# Patient Record
Sex: Female | Born: 1970 | Race: Black or African American | Hispanic: No | Marital: Married | Smoking: Never smoker
Health system: Southern US, Community
[De-identification: ages and names within clinical notes are randomized; demographics above are authoritative.]

## PROBLEM LIST (undated history)

## (undated) DIAGNOSIS — R000 Tachycardia, unspecified: Secondary | ICD-10-CM

## (undated) DIAGNOSIS — R12 Heartburn: Secondary | ICD-10-CM

## (undated) DIAGNOSIS — E039 Hypothyroidism, unspecified: Secondary | ICD-10-CM

## (undated) DIAGNOSIS — D219 Benign neoplasm of connective and other soft tissue, unspecified: Secondary | ICD-10-CM

## (undated) DIAGNOSIS — J302 Other seasonal allergic rhinitis: Secondary | ICD-10-CM

## (undated) DIAGNOSIS — E785 Hyperlipidemia, unspecified: Secondary | ICD-10-CM

## (undated) DIAGNOSIS — E282 Polycystic ovarian syndrome: Secondary | ICD-10-CM

## (undated) HISTORY — DX: Tachycardia, unspecified: R00.0

## (undated) HISTORY — DX: Benign neoplasm of connective and other soft tissue, unspecified: D21.9

## (undated) HISTORY — DX: Polycystic ovarian syndrome: E28.2

## (undated) HISTORY — DX: Other seasonal allergic rhinitis: J30.2

---

## 1998-02-16 ENCOUNTER — Emergency Department (HOSPITAL_COMMUNITY): Admission: EM | Admit: 1998-02-16 | Discharge: 1998-02-16 | Payer: Self-pay | Admitting: Emergency Medicine

## 1998-03-09 ENCOUNTER — Ambulatory Visit (HOSPITAL_COMMUNITY): Admission: RE | Admit: 1998-03-09 | Discharge: 1998-03-09 | Payer: Self-pay | Admitting: Obstetrics

## 1998-03-09 ENCOUNTER — Other Ambulatory Visit: Admission: RE | Admit: 1998-03-09 | Discharge: 1998-03-09 | Payer: Self-pay | Admitting: Obstetrics

## 1998-04-12 ENCOUNTER — Ambulatory Visit (HOSPITAL_COMMUNITY): Admission: RE | Admit: 1998-04-12 | Discharge: 1998-04-12 | Payer: Self-pay | Admitting: Obstetrics

## 1998-04-22 ENCOUNTER — Encounter: Payer: Self-pay | Admitting: Endocrinology

## 1998-04-22 ENCOUNTER — Ambulatory Visit (HOSPITAL_COMMUNITY): Admission: RE | Admit: 1998-04-22 | Discharge: 1998-04-22 | Payer: Self-pay | Admitting: Endocrinology

## 1998-05-18 ENCOUNTER — Ambulatory Visit (HOSPITAL_COMMUNITY): Admission: RE | Admit: 1998-05-18 | Discharge: 1998-05-18 | Payer: Self-pay | Admitting: Endocrinology

## 1998-05-18 ENCOUNTER — Encounter: Payer: Self-pay | Admitting: Endocrinology

## 1999-02-16 ENCOUNTER — Other Ambulatory Visit: Admission: RE | Admit: 1999-02-16 | Discharge: 1999-02-16 | Payer: Self-pay | Admitting: Obstetrics

## 2000-03-07 ENCOUNTER — Other Ambulatory Visit: Admission: RE | Admit: 2000-03-07 | Discharge: 2000-03-07 | Payer: Self-pay | Admitting: *Deleted

## 2001-03-14 ENCOUNTER — Other Ambulatory Visit: Admission: RE | Admit: 2001-03-14 | Discharge: 2001-03-14 | Payer: Self-pay | Admitting: *Deleted

## 2001-07-12 ENCOUNTER — Encounter: Admission: RE | Admit: 2001-07-12 | Discharge: 2001-07-12 | Payer: Self-pay | Admitting: *Deleted

## 2001-07-24 ENCOUNTER — Encounter: Admission: RE | Admit: 2001-07-24 | Discharge: 2001-07-24 | Payer: Self-pay | Admitting: *Deleted

## 2001-08-06 ENCOUNTER — Encounter: Payer: Self-pay | Admitting: Ophthalmology

## 2001-08-06 ENCOUNTER — Ambulatory Visit (HOSPITAL_COMMUNITY): Admission: RE | Admit: 2001-08-06 | Discharge: 2001-08-06 | Payer: Self-pay | Admitting: Ophthalmology

## 2002-01-08 ENCOUNTER — Emergency Department (HOSPITAL_COMMUNITY): Admission: EM | Admit: 2002-01-08 | Discharge: 2002-01-08 | Payer: Self-pay | Admitting: *Deleted

## 2002-03-19 ENCOUNTER — Other Ambulatory Visit: Admission: RE | Admit: 2002-03-19 | Discharge: 2002-03-19 | Payer: Self-pay | Admitting: Gynecology

## 2002-06-23 ENCOUNTER — Other Ambulatory Visit: Admission: RE | Admit: 2002-06-23 | Discharge: 2002-06-23 | Payer: Self-pay | Admitting: *Deleted

## 2002-08-08 ENCOUNTER — Encounter: Admission: RE | Admit: 2002-08-08 | Discharge: 2002-08-08 | Payer: Self-pay | Admitting: *Deleted

## 2002-08-08 ENCOUNTER — Encounter: Payer: Self-pay | Admitting: *Deleted

## 2002-10-24 ENCOUNTER — Other Ambulatory Visit: Admission: RE | Admit: 2002-10-24 | Discharge: 2002-10-24 | Payer: Self-pay | Admitting: Obstetrics and Gynecology

## 2003-04-17 ENCOUNTER — Other Ambulatory Visit: Admission: RE | Admit: 2003-04-17 | Discharge: 2003-04-17 | Payer: Self-pay | Admitting: Obstetrics and Gynecology

## 2004-06-22 ENCOUNTER — Other Ambulatory Visit: Admission: RE | Admit: 2004-06-22 | Discharge: 2004-06-22 | Payer: Self-pay | Admitting: Obstetrics and Gynecology

## 2005-06-06 ENCOUNTER — Emergency Department (HOSPITAL_COMMUNITY): Admission: EM | Admit: 2005-06-06 | Discharge: 2005-06-06 | Payer: Self-pay | Admitting: Emergency Medicine

## 2005-06-23 ENCOUNTER — Other Ambulatory Visit: Admission: RE | Admit: 2005-06-23 | Discharge: 2005-06-23 | Payer: Self-pay | Admitting: Obstetrics and Gynecology

## 2006-05-05 ENCOUNTER — Emergency Department (HOSPITAL_COMMUNITY): Admission: EM | Admit: 2006-05-05 | Discharge: 2006-05-05 | Payer: Self-pay | Admitting: Family Medicine

## 2006-09-21 ENCOUNTER — Other Ambulatory Visit: Admission: RE | Admit: 2006-09-21 | Discharge: 2006-09-21 | Payer: Self-pay | Admitting: Obstetrics and Gynecology

## 2007-07-08 ENCOUNTER — Ambulatory Visit (HOSPITAL_COMMUNITY): Admission: RE | Admit: 2007-07-08 | Discharge: 2007-07-08 | Payer: Self-pay | Admitting: Otolaryngology

## 2007-09-25 ENCOUNTER — Other Ambulatory Visit: Admission: RE | Admit: 2007-09-25 | Discharge: 2007-09-25 | Payer: Self-pay | Admitting: Obstetrics and Gynecology

## 2007-10-24 ENCOUNTER — Emergency Department (HOSPITAL_COMMUNITY): Admission: EM | Admit: 2007-10-24 | Discharge: 2007-10-24 | Payer: Self-pay | Admitting: Emergency Medicine

## 2008-04-09 ENCOUNTER — Ambulatory Visit: Payer: Self-pay | Admitting: Women's Health

## 2008-04-21 ENCOUNTER — Ambulatory Visit: Payer: Self-pay | Admitting: Obstetrics and Gynecology

## 2008-04-28 ENCOUNTER — Ambulatory Visit: Payer: Self-pay | Admitting: Obstetrics and Gynecology

## 2008-04-28 ENCOUNTER — Ambulatory Visit (HOSPITAL_COMMUNITY): Admission: RE | Admit: 2008-04-28 | Discharge: 2008-04-28 | Payer: Self-pay | Admitting: Obstetrics and Gynecology

## 2008-05-05 ENCOUNTER — Encounter: Admission: RE | Admit: 2008-05-05 | Discharge: 2008-05-05 | Payer: Self-pay | Admitting: Otolaryngology

## 2008-05-11 ENCOUNTER — Ambulatory Visit: Payer: Self-pay | Admitting: Obstetrics and Gynecology

## 2008-05-15 ENCOUNTER — Ambulatory Visit: Payer: Self-pay | Admitting: Obstetrics and Gynecology

## 2008-06-15 ENCOUNTER — Ambulatory Visit: Payer: Self-pay | Admitting: Gynecology

## 2008-07-07 ENCOUNTER — Ambulatory Visit: Payer: Self-pay | Admitting: Obstetrics and Gynecology

## 2008-07-13 ENCOUNTER — Ambulatory Visit: Payer: Self-pay | Admitting: Obstetrics and Gynecology

## 2008-08-03 ENCOUNTER — Ambulatory Visit: Payer: Self-pay | Admitting: Obstetrics and Gynecology

## 2008-08-07 ENCOUNTER — Ambulatory Visit: Payer: Self-pay | Admitting: Obstetrics and Gynecology

## 2008-08-10 ENCOUNTER — Ambulatory Visit: Payer: Self-pay | Admitting: Obstetrics and Gynecology

## 2008-09-14 ENCOUNTER — Ambulatory Visit: Payer: Self-pay | Admitting: Obstetrics and Gynecology

## 2008-12-03 ENCOUNTER — Other Ambulatory Visit: Admission: RE | Admit: 2008-12-03 | Discharge: 2008-12-03 | Payer: Self-pay | Admitting: Obstetrics and Gynecology

## 2008-12-03 ENCOUNTER — Ambulatory Visit: Payer: Self-pay | Admitting: Obstetrics and Gynecology

## 2008-12-03 ENCOUNTER — Encounter: Payer: Self-pay | Admitting: Obstetrics and Gynecology

## 2009-04-12 ENCOUNTER — Ambulatory Visit: Payer: Self-pay | Admitting: Obstetrics and Gynecology

## 2009-04-13 ENCOUNTER — Ambulatory Visit: Payer: Self-pay | Admitting: Obstetrics and Gynecology

## 2009-05-11 ENCOUNTER — Ambulatory Visit: Payer: Self-pay | Admitting: Obstetrics and Gynecology

## 2009-06-05 HISTORY — PX: MYOMECTOMY: SHX85

## 2009-06-15 ENCOUNTER — Encounter: Payer: Self-pay | Admitting: Obstetrics and Gynecology

## 2009-06-15 ENCOUNTER — Ambulatory Visit: Payer: Self-pay | Admitting: Obstetrics and Gynecology

## 2009-06-15 ENCOUNTER — Inpatient Hospital Stay (HOSPITAL_COMMUNITY): Admission: RE | Admit: 2009-06-15 | Discharge: 2009-06-17 | Payer: Self-pay | Admitting: Obstetrics and Gynecology

## 2009-06-28 ENCOUNTER — Ambulatory Visit: Payer: Self-pay | Admitting: Obstetrics and Gynecology

## 2009-06-29 ENCOUNTER — Encounter: Admission: RE | Admit: 2009-06-29 | Discharge: 2009-06-29 | Payer: Self-pay | Admitting: Family Medicine

## 2009-07-06 ENCOUNTER — Ambulatory Visit: Payer: Self-pay | Admitting: Obstetrics and Gynecology

## 2009-07-15 ENCOUNTER — Ambulatory Visit: Payer: Self-pay | Admitting: Obstetrics and Gynecology

## 2009-09-02 ENCOUNTER — Ambulatory Visit: Payer: Self-pay | Admitting: Obstetrics and Gynecology

## 2010-01-07 IMAGING — RF DG HYSTEROGRAM
5 series · 5 of 5 positions shown · non-contrast
Comparison: none

CLINICAL DATA: Infertility

HYSTEROSALPINGOGRAM
TECHNIQUE: Hysterosalpingogram was performed by the ordering
physician under fluoroscopy.  Fluoroscopic images are submitted for
interpretation following the procedure.
Fluoroscopy time:  0.8 minutes

[Series 1: run · 1 of 1 slices shown (1 of 5)]
[im 1/1]
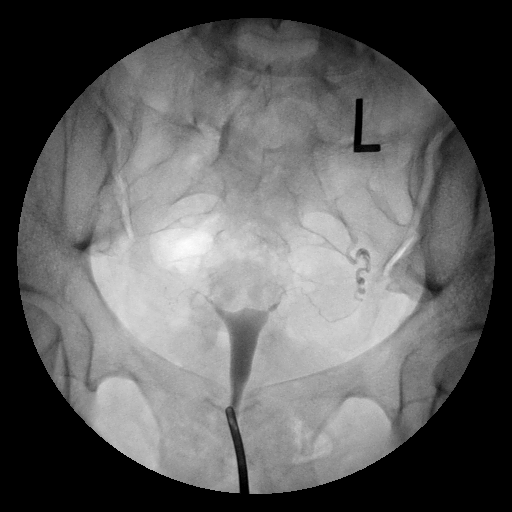

[Series 2: run · 1 of 1 slices shown (2 of 5)]
[im 1/1]
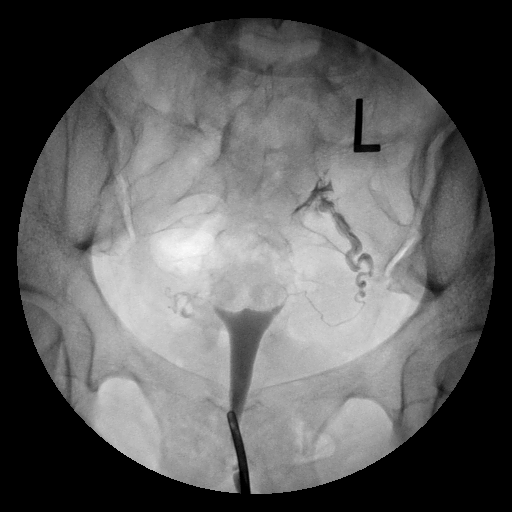

[Series 3: run · 1 of 1 slices shown (3 of 5)]
[im 1/1]
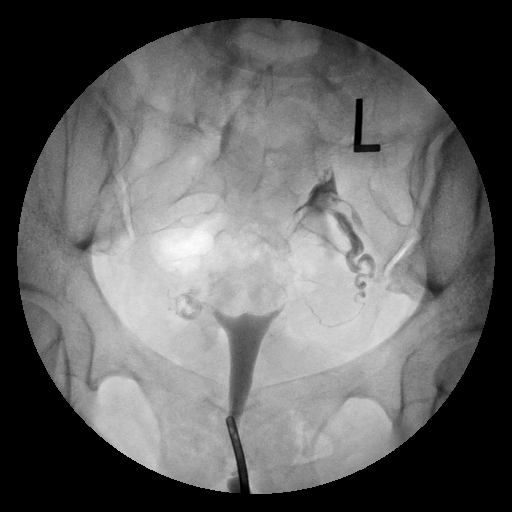

[Series 4: run · 1 of 1 slices shown (4 of 5)]
[im 1/1]
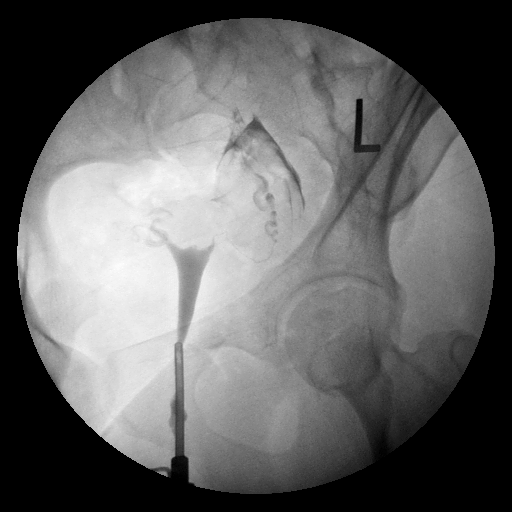

[Series 5: run · 1 of 1 slices shown (5 of 5)]
[im 1/1]
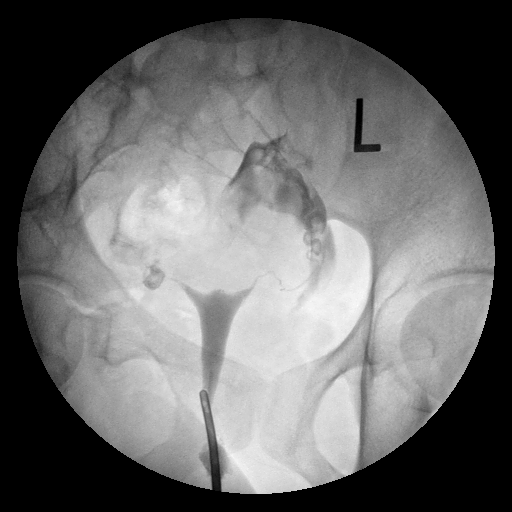

[5 of 5 positions shown; findings below may reference images not displayed]

FINDINGS: Endometrial cavity is normal in contour and
opacification.  No filling defects are identified in the
endometrial cavity.

The left fallopian tube is well opacified with contrast and there
is definite free intraperitoneal spill from the left fallopian
tube.

The right fallopian tube is normal in caliber and is opacified to
its distal aspect.  No definite free intraperitoneal spill from the
right fallopian tube is appreciated on the images provided.
IMPRESSION: 1.  The left fallopian tube is patent.
2.  The right fallopian tube is opacified to its distal aspect.  No
definite free intraperitoneal spill is identified from the right
ovary on these static images. Correlation with findings at real
time imaging is suggested.
3.  Normal appearance of the endometrial cavity.

## 2010-02-24 ENCOUNTER — Encounter: Admission: RE | Admit: 2010-02-24 | Discharge: 2010-02-24 | Payer: Self-pay | Admitting: Endocrinology

## 2010-06-15 ENCOUNTER — Other Ambulatory Visit
Admission: RE | Admit: 2010-06-15 | Discharge: 2010-06-15 | Payer: Self-pay | Source: Home / Self Care | Admitting: Obstetrics and Gynecology

## 2010-06-15 ENCOUNTER — Ambulatory Visit
Admission: RE | Admit: 2010-06-15 | Discharge: 2010-06-15 | Payer: Self-pay | Source: Home / Self Care | Attending: Obstetrics and Gynecology | Admitting: Obstetrics and Gynecology

## 2010-07-13 ENCOUNTER — Ambulatory Visit (INDEPENDENT_AMBULATORY_CARE_PROVIDER_SITE_OTHER): Payer: Self-pay | Admitting: Women's Health

## 2010-07-13 DIAGNOSIS — N898 Other specified noninflammatory disorders of vagina: Secondary | ICD-10-CM

## 2010-07-13 DIAGNOSIS — Z113 Encounter for screening for infections with a predominantly sexual mode of transmission: Secondary | ICD-10-CM

## 2010-07-13 DIAGNOSIS — B373 Candidiasis of vulva and vagina: Secondary | ICD-10-CM

## 2010-07-13 DIAGNOSIS — E039 Hypothyroidism, unspecified: Secondary | ICD-10-CM

## 2010-08-21 LAB — CBC
HCT: 40.2 % (ref 36.0–46.0)
Hemoglobin: 13.7 g/dL (ref 12.0–15.0)
MCHC: 34 g/dL (ref 30.0–36.0)
MCV: 88.9 fL (ref 78.0–100.0)
RBC: 4.52 MIL/uL (ref 3.87–5.11)
WBC: 4.3 10*3/uL (ref 4.0–10.5)

## 2010-08-21 LAB — URINALYSIS, ROUTINE W REFLEX MICROSCOPIC
Bilirubin Urine: NEGATIVE
Glucose, UA: NEGATIVE mg/dL
Hgb urine dipstick: NEGATIVE
Ketones, ur: NEGATIVE mg/dL
Nitrite: NEGATIVE
Specific Gravity, Urine: 1.01 (ref 1.005–1.030)
pH: 7.5 (ref 5.0–8.0)

## 2010-08-21 LAB — HCG, SERUM, QUALITATIVE: Preg, Serum: NEGATIVE

## 2010-10-07 ENCOUNTER — Ambulatory Visit (INDEPENDENT_AMBULATORY_CARE_PROVIDER_SITE_OTHER): Payer: BC Managed Care – PPO | Admitting: Women's Health

## 2010-10-07 DIAGNOSIS — Z113 Encounter for screening for infections with a predominantly sexual mode of transmission: Secondary | ICD-10-CM

## 2010-10-07 DIAGNOSIS — Z124 Encounter for screening for malignant neoplasm of cervix: Secondary | ICD-10-CM

## 2010-10-07 DIAGNOSIS — B373 Candidiasis of vulva and vagina: Secondary | ICD-10-CM

## 2010-10-07 DIAGNOSIS — E039 Hypothyroidism, unspecified: Secondary | ICD-10-CM

## 2010-10-07 DIAGNOSIS — N760 Acute vaginitis: Secondary | ICD-10-CM

## 2010-10-28 ENCOUNTER — Ambulatory Visit (INDEPENDENT_AMBULATORY_CARE_PROVIDER_SITE_OTHER): Payer: BC Managed Care – PPO | Admitting: Women's Health

## 2010-10-28 DIAGNOSIS — N898 Other specified noninflammatory disorders of vagina: Secondary | ICD-10-CM

## 2010-10-28 DIAGNOSIS — B373 Candidiasis of vulva and vagina: Secondary | ICD-10-CM

## 2010-12-27 ENCOUNTER — Other Ambulatory Visit: Payer: Self-pay | Admitting: Women's Health

## 2010-12-27 DIAGNOSIS — E039 Hypothyroidism, unspecified: Secondary | ICD-10-CM

## 2010-12-27 MED ORDER — LEVOTHYROXINE SODIUM 125 MCG PO TABS: 125.0000 ug | ORAL_TABLET | Freq: Every day | ORAL | Status: DC

## 2010-12-27 NOTE — Telephone Encounter (Signed)
tsh .04 need to lower dose TC to pt. Will start 125 Synthroid daily and re check in 2-3 months.

## 2010-12-28 ENCOUNTER — Encounter: Payer: Self-pay | Admitting: Obstetrics and Gynecology

## 2011-02-08 ENCOUNTER — Ambulatory Visit (INDEPENDENT_AMBULATORY_CARE_PROVIDER_SITE_OTHER): Payer: BC Managed Care – PPO | Admitting: Women's Health

## 2011-02-08 ENCOUNTER — Other Ambulatory Visit: Payer: Self-pay

## 2011-02-08 ENCOUNTER — Encounter: Payer: Self-pay | Admitting: Women's Health

## 2011-02-08 VITALS — BP 124/70

## 2011-02-08 DIAGNOSIS — M5126 Other intervertebral disc displacement, lumbar region: Secondary | ICD-10-CM

## 2011-02-08 DIAGNOSIS — D219 Benign neoplasm of connective and other soft tissue, unspecified: Secondary | ICD-10-CM

## 2011-02-08 DIAGNOSIS — N898 Other specified noninflammatory disorders of vagina: Secondary | ICD-10-CM

## 2011-02-08 DIAGNOSIS — E039 Hypothyroidism, unspecified: Secondary | ICD-10-CM | POA: Insufficient documentation

## 2011-02-08 DIAGNOSIS — E282 Polycystic ovarian syndrome: Secondary | ICD-10-CM | POA: Insufficient documentation

## 2011-02-08 DIAGNOSIS — L293 Anogenital pruritus, unspecified: Secondary | ICD-10-CM

## 2011-02-08 DIAGNOSIS — D259 Leiomyoma of uterus, unspecified: Secondary | ICD-10-CM

## 2011-02-08 DIAGNOSIS — B373 Candidiasis of vulva and vagina: Secondary | ICD-10-CM

## 2011-02-08 HISTORY — DX: Other intervertebral disc displacement, lumbar region: M51.26

## 2011-02-08 MED ORDER — TERCONAZOLE 0.4 % VA CREA: 1.0000 | TOPICAL_CREAM | Freq: Every day | VAGINAL | Status: AC

## 2011-02-08 NOTE — Progress Notes (Signed)
  Presents with a complaint of a vaginal discharge with some itching. States did use some Terazol without much relief several days ago. Same partner with negative cultures. States has also been having some increased flow with periods. History of fibroids, myomectomy in 2011 for 6 cm fibroid. States had less menorrhagia after her myomectomy, states her cycles are getting progressively heavy. External genitalia is within normal limits, speculum exam cervix is pink scant white discharge was noted. Wet prep positive for yeast. Bimanual uterus was nontender, approximate 8 wk size. Difficult to assess size due to weight.  Assessment; yeast vaginitis, menorrhagia  Plan: Terazol 7 one applicator at bedtime x7 prescription, proper use was given, yeast prevention discussed, call if no relief. Ultrasound after her next cycle to assess fibroids. She is also contemplating donor sperm for insemination, will discuss with Dr. Eda Paschal.

## 2011-02-20 ENCOUNTER — Other Ambulatory Visit: Payer: Self-pay | Admitting: Gynecology

## 2011-02-20 DIAGNOSIS — N949 Unspecified condition associated with female genital organs and menstrual cycle: Secondary | ICD-10-CM

## 2011-02-23 ENCOUNTER — Other Ambulatory Visit: Payer: Self-pay | Admitting: Women's Health

## 2011-02-23 ENCOUNTER — Ambulatory Visit (INDEPENDENT_AMBULATORY_CARE_PROVIDER_SITE_OTHER): Payer: BC Managed Care – PPO | Admitting: Women's Health

## 2011-02-23 ENCOUNTER — Ambulatory Visit: Admission: RE | Admit: 2011-02-23 | Payer: BC Managed Care – PPO | Source: Ambulatory Visit

## 2011-02-23 DIAGNOSIS — N949 Unspecified condition associated with female genital organs and menstrual cycle: Secondary | ICD-10-CM

## 2011-02-23 DIAGNOSIS — D259 Leiomyoma of uterus, unspecified: Secondary | ICD-10-CM

## 2011-02-23 DIAGNOSIS — N920 Excessive and frequent menstruation with regular cycle: Secondary | ICD-10-CM

## 2011-02-23 DIAGNOSIS — D219 Benign neoplasm of connective and other soft tissue, unspecified: Secondary | ICD-10-CM

## 2011-02-23 NOTE — Progress Notes (Signed)
  History of a myomectomy in 2011 (fibroid -53mm mean)  cycles every month to every other month, history of PCO S.,  has lost 35 pounds with [employer] since May 2012. She is also hypothyroid on Synthroid. History of infertility, uses condoms currently.  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 Reviewed options of metformin to help with cycle control, reviewed risks of metabolic syndrome with PCO S., will continue Weight Watchers and exercise. States would like to conceive, but not in a relationship at this time, and is contemplating adoption.

## 2011-08-07 ENCOUNTER — Other Ambulatory Visit (HOSPITAL_COMMUNITY)
Admission: RE | Admit: 2011-08-07 | Discharge: 2011-08-07 | Disposition: A | Discharge status: Home / Self Care | Payer: BC Managed Care – PPO | Source: Ambulatory Visit | Attending: Obstetrics and Gynecology | Admitting: Obstetrics and Gynecology

## 2011-08-07 ENCOUNTER — Encounter: Payer: Self-pay | Admitting: Women's Health

## 2011-08-07 ENCOUNTER — Ambulatory Visit (INDEPENDENT_AMBULATORY_CARE_PROVIDER_SITE_OTHER): Payer: BC Managed Care – PPO | Admitting: Women's Health

## 2011-08-07 VITALS — BP 130/80 | Ht 63.5 in | Wt 213.0 lb

## 2011-08-07 DIAGNOSIS — Z01419 Encounter for gynecological examination (general) (routine) without abnormal findings: Secondary | ICD-10-CM

## 2011-08-07 DIAGNOSIS — E039 Hypothyroidism, unspecified: Secondary | ICD-10-CM

## 2011-08-07 DIAGNOSIS — Z13220 Encounter for screening for lipoid disorders: Secondary | ICD-10-CM

## 2011-08-07 DIAGNOSIS — Z833 Family history of diabetes mellitus: Secondary | ICD-10-CM

## 2011-08-07 DIAGNOSIS — Z113 Encounter for screening for infections with a predominantly sexual mode of transmission: Secondary | ICD-10-CM

## 2011-08-07 MED ORDER — LEVOTHYROXINE SODIUM 125 MCG PO TABS: 125.0000 ug | ORAL_TABLET | Freq: Every day | ORAL | Status: DC

## 2011-08-07 NOTE — Patient Instructions (Signed)

## 2011-08-07 NOTE — Progress Notes (Signed)
Suzanne Beasley 06-21-1970 [id number]    History:    The patient presents for annual exam.  Cycles every 35-38 days for 7 days, new partner/vasectomy. History of infertility. Myomectomy in 2011. Questionable right tube patency. Hypothyroid on 125 mcg daily. History of ASCUS and normal Paps after. Has lost 30 pounds with Weight Watchers and exercise.   Past medical history, past surgical history, family history and social history were all reviewed and documented in the EPIC chart.   ROS:  A  ROS was performed and pertinent positives and negatives are included in the history.  Exam:  Filed Vitals:   08/07/11 1155  BP: 130/80    General appearance:  Normal Head/Neck:  Normal, without cervical or supraclavicular adenopathy. Thyroid:  Symmetrical, normal in size, without palpable masses or nodularity. Respiratory  Effort:  Normal  Auscultation:  Clear without wheezing or rhonchi Cardiovascular  Auscultation:  Regular rate, without rubs, murmurs or gallops  Edema/varicosities:  Not grossly evident Abdominal  Soft,nontender, without masses, guarding or rebound.  Liver/spleen:  No organomegaly noted  Hernia:  None appreciated  Skin  Inspection:  Grossly normal  Palpation:  Grossly normal Neurologic/psychiatric  Orientation:  Normal with appropriate conversation.  Mood/affect:  Normal  Genitourinary    Breasts: Examined lying and sitting.     Right: Without masses, retractions, discharge or axillary adenopathy.     Left: Without masses, retractions, discharge or axillary adenopathy.   Inguinal/mons:  Normal without inguinal adenopathy  External genitalia:  Normal  BUS/Urethra/Skene's glands:  Normal  Bladder:  Normal  Vagina:  Normal  Cervix:  Normal  Uterus:   normal in size, shape and contour.  Midline and mobile  Adnexa/parametria:     Rt: Without masses or tenderness.   Lt: Without masses or tenderness.  Anus and perineum: Normal  Digital rectal exam: Normal  sphincter tone without palpated masses or tenderness  Assessment/Plan:  41 y.o. WBF G0 for annual exam with no complaints.     Ascus 04, normal Paps after  History of fibroids/myomectomy 2011/infertility Hypothyroidism STD screening  Plan: Congratulated on weight loss through diet and exercise. SBE's, calcium rich diet, and MVI daily encouraged. Synthroid 125 mcg by mouth daily, prescription, proper use given and reviewed. If TSH in normal range, will repeat in 6 months due to weight loss. CBC, glucose, lipid profile,TSH, UA, Pap, GC/Chlamydia, HIV, hep B and C., RPRHar[facility], 12:39 PM 08/07/2011

## 2011-08-08 LAB — URINALYSIS W MICROSCOPIC + REFLEX CULTURE
Bacteria, UA: NONE SEEN
Casts: NONE SEEN
Glucose, UA: NEGATIVE mg/dL
Ketones, ur: NEGATIVE mg/dL
Protein, ur: NEGATIVE mg/dL
pH: 6.5 (ref 5.0–8.0)

## 2011-08-08 LAB — CBC WITH DIFFERENTIAL/PLATELET
Eosinophils Relative: 1 % (ref 0–5)
Hemoglobin: 13 g/dL (ref 12.0–15.0)
Lymphocytes Relative: 40 % (ref 12–46)
Lymphs Abs: 1.7 10*3/uL (ref 0.7–4.0)
MCH: 29.5 pg (ref 26.0–34.0)
MCV: 87.3 fL (ref 78.0–100.0)
Monocytes Relative: 10 % (ref 3–12)
Neutrophils Relative %: 48 % (ref 43–77)
Platelets: 248 10*3/uL (ref 150–400)
RBC: 4.4 MIL/uL (ref 3.87–5.11)
WBC: 4.2 10*3/uL (ref 4.0–10.5)

## 2011-08-08 LAB — LIPID PANEL
HDL: 67 mg/dL (ref >39)
Total CHOL/HDL Ratio: 3.3 Ratio
Triglycerides: 55 mg/dL (ref <150)

## 2011-08-08 LAB — GLUCOSE, RANDOM: Glucose, Bld: 83 mg/dL (ref 70–99)

## 2011-08-08 LAB — TSH: TSH: 8.548 u[IU]/mL — ABNORMAL HIGH (ref 0.350–4.500)

## 2011-10-10 ENCOUNTER — Telehealth: Payer: Self-pay | Admitting: *Deleted

## 2011-10-10 NOTE — Telephone Encounter (Signed)
Pt is on thyroid medication and just started a diet pill called Cellucor, CLK and HDL; see below for ingredients. She wants to know if it will affect her synthroid. PLs advise Cellucor CLK Nutritional Info  Select a product to view its nutrition info:    Please select an item from the list above to view nutrition information. 60 Softgels Serving Size: 3 Softgels Servings Per Container: 20  Amount Per Serving Amt %DV  Calories  25   Calories from Fat  25   Total Fat 2g 3%*  CLA (Conjugated Linoleic Acid)  1.7g ?  L-Carnitine (as Carnitine Tartrate) 500mg   ?  Razberi-K (Raspberry Ketones)  100mg  ?  7-Keto DHEA (3B-Acetoxyandrost-5-en-7, 17-Dione)  100mg  ?  * % Daily Value is based on a 2,000 calorie diet. Your daily values may be higher or lower based on your calorie needs. ? Daily Value (DV) not established.   Ingredients  Other Ingredients: Gelatin, Glycerin, Purified Water, Beeswax, Soy Lecithin (Soy), Natural and Artificial Flavor, Titanium Dioxide, FD&C Red #3, Silicon Dioxide, FD&C Red #40, FD&C Yellow #5, FD&C Blue #1.  Allergen Warning  Contains soy.   Cellucor HD Supplement Facts  Serving Size 1 CAPSULE  Servings Per Container 60     Amount Per Serving % Daily Value    NIACIN 10 Mg 50%     VITAMIN B6 3 Mg 150%     VITAMIN B12 250 Mcg 4167%     SUPER HD NOOTROPIC & CNS SUPPORT BLEND 0  N/A*     CAFFEINE ANHYDROUS 160 Mg N/A*     N-ACETYL-L-TYROSINE 150 Mg N/A*     TOOTHED CLUBMOSS (AERIAL PARTS) (1% HUPERZINE A) 2.5 Mg N/A*     SUPER HD THERMOSCULPTING BLEND IFAS50 (CAMELLIA SINENSIS LEAF EXTRACT, TUBER FLEECE FLOWER ROOT EXTRACT AND CHINESE MISTLETOE STEM EXTRACT), RHODIOLA ROSEA ROOT EXTRACT, AMLA FRUIT EXTRACT, DANDELION ROOT EXTRACT, PAUSINYSTALIA YOHIMBE EXTRACT, RED PEPPER FRUIT (2% CAPSAICINOIDS) (CAPSIMAX), EVODIAMINE 98%, RAUWOLFIA EXTRACT 253 Mg N/A*     B VITAMIN BLEND NIACINAMIDE, PYRIDOXAL-5-PHOSPHATE, CYANOCOBALAMIN, METHYLCOBALAMIN, AND  COENZYME B12 15.25 Mg N/A*       * Daily value not established  Other Ingredients: Capsule shell (gelatin and titanium dioxide), microcrystalline cellulose, magnesium stearate, silica.

## 2011-10-11 NOTE — Telephone Encounter (Signed)
Pt informed with the below note. 

## 2011-10-11 NOTE — Telephone Encounter (Signed)
Please call, take synthroid first thing in am and wait an hour before taking.  This supplement is to help get you started not for long term use.  Hope it helps.

## 2012-01-12 ENCOUNTER — Other Ambulatory Visit: Payer: Self-pay | Admitting: Women's Health

## 2012-01-12 NOTE — Telephone Encounter (Signed)
I called patient and left message reminding her that she will need to come in for TSH level to be re-checked in the next month.  At 08/07/11 office visit [place] indicated repeat TSH in 6 mos.

## 2012-02-20 ENCOUNTER — Telehealth: Payer: Self-pay | Admitting: *Deleted

## 2012-02-20 NOTE — Telephone Encounter (Signed)
(  pt aware you are out of the office) Pt faxed over recent TSH result from her job. Pt is requesting refill on synthroid 125 mcg. Please advise

## 2012-02-21 MED ORDER — LEVOTHYROXINE SODIUM 125 MCG PO TABS: 125.0000 ug | ORAL_TABLET | Freq: Every day | ORAL | Status: DC

## 2012-02-21 NOTE — Telephone Encounter (Signed)
Please call patient and inform TSH 2.53  done Oct 11, 2011. Please call in 3 refills of Synthroid, recheck TSH December 2013.

## 2012-02-21 NOTE — Telephone Encounter (Signed)
rx sent , pt informed with the below note. 

## 2012-03-18 ENCOUNTER — Encounter: Payer: Self-pay | Admitting: Women's Health

## 2012-03-18 ENCOUNTER — Ambulatory Visit (INDEPENDENT_AMBULATORY_CARE_PROVIDER_SITE_OTHER): Payer: BC Managed Care – PPO | Admitting: Women's Health

## 2012-03-18 DIAGNOSIS — E039 Hypothyroidism, unspecified: Secondary | ICD-10-CM

## 2012-03-18 DIAGNOSIS — N898 Other specified noninflammatory disorders of vagina: Secondary | ICD-10-CM

## 2012-03-18 LAB — WET PREP FOR TRICH, YEAST, CLUE
Trich, Wet Prep: NONE SEEN
WBC, Wet Prep HPF POC: NONE SEEN
Yeast Wet Prep HPF POC: NONE SEEN

## 2012-03-18 MED ORDER — METRONIDAZOLE 0.75 % VA GEL: VAGINAL | Status: DC

## 2012-03-18 NOTE — Progress Notes (Signed)
Patient ID: Suzanne Beasley, female   DOB: 1971-05-15, 41 y.o.   MRN: [id number] Presents with the complaint of vaginal discharge with itching for several days. Currently on Synthroid 125 mcg daily. Monthly cycle that is several days late states feels like cycle is going to start. Has a 1 cm under the skin round mobile nontender area at the upper thigh that she has noticed for several weeks.  Exam: Upper right thigh 1 cm nontender mobile superficial probable fatty tissue.  External genitalia slight erythema, speculum exam moderate amount of a white adherent discharge, wet prep positive for amines, clue cells, TNTC WBCs. Bimanual no CMT or adnexal fullness or tenderness.  BV  Plan: Reviewed probable small fatty tissue, it instructed to call or return if area with change. MetroGel vaginal cream 1 applicator at bedtime x5, instructed to call if cycle does not start. TSH, continue Synthroid 125 mcg.

## 2012-03-18 NOTE — Patient Instructions (Addendum)

## 2012-03-19 LAB — TSH: TSH: 1.751 u[IU]/mL (ref 0.350–4.500)

## 2012-04-22 ENCOUNTER — Other Ambulatory Visit: Payer: Self-pay | Admitting: Women's Health

## 2012-04-22 NOTE — Telephone Encounter (Signed)
Ok to refill, call pt and inform if no relief office visit   Thanks

## 2012-05-31 ENCOUNTER — Ambulatory Visit (INDEPENDENT_AMBULATORY_CARE_PROVIDER_SITE_OTHER): Payer: BC Managed Care – PPO | Admitting: Gynecology

## 2012-05-31 ENCOUNTER — Encounter: Payer: Self-pay | Admitting: Gynecology

## 2012-05-31 DIAGNOSIS — L293 Anogenital pruritus, unspecified: Secondary | ICD-10-CM

## 2012-05-31 DIAGNOSIS — L292 Pruritus vulvae: Secondary | ICD-10-CM

## 2012-05-31 DIAGNOSIS — N760 Acute vaginitis: Secondary | ICD-10-CM

## 2012-05-31 DIAGNOSIS — A499 Bacterial infection, unspecified: Secondary | ICD-10-CM

## 2012-05-31 DIAGNOSIS — N898 Other specified noninflammatory disorders of vagina: Secondary | ICD-10-CM

## 2012-05-31 LAB — WET PREP FOR TRICH, YEAST, CLUE: WBC, Wet Prep HPF POC: NONE SEEN

## 2012-05-31 MED ORDER — METRONIDAZOLE 500 MG PO TABS: 500.0000 mg | ORAL_TABLET | Freq: Two times a day (BID) | ORAL | Status: DC

## 2012-05-31 MED ORDER — FLUCONAZOLE 200 MG PO TABS: 200.0000 mg | ORAL_TABLET | Freq: Every day | ORAL | Status: DC

## 2012-05-31 NOTE — Progress Notes (Signed)
Patient presents having recently been treated October 2013 by Harriett Sine for bacterial vaginosis with MetroGel. Notes that her symptoms of vaginal itching and discharge transiently resolved but have returned.  No odor or urinary tract symptoms.  Exam with Fleet Contras assistant Abdomen soft nontender without masses guarding rebound organomegaly. Pelvic external BUS vagina with thick cakey discharge. Cervix normal. Uterus normal size and mobile nontender. Adnexa without masses or tenderness.  Assessment and plan: Vaginal itching and discharge. Wet prep suggest rectovaginal doses. Exam/symptoms of itching suggests yeast with cakey white discharge. Will cover both with Diflucan 200 daily x5 days and Flagyl 500 mg twice a day x7 days, alcohol avoidance reviewed. Follow up if symptoms persist or recur.

## 2012-05-31 NOTE — Patient Instructions (Signed)
Take diflucan daily for 5 days Take flagyl 2 times daily for 7 days, avoid alcohol while taking

## 2012-08-08 ENCOUNTER — Encounter: Payer: BC Managed Care – PPO | Admitting: Women's Health

## 2012-08-19 ENCOUNTER — Encounter: Payer: Self-pay | Admitting: Women's Health

## 2012-08-19 ENCOUNTER — Ambulatory Visit: Payer: Commercial Managed Care - PPO | Admitting: Women's Health

## 2012-08-19 VITALS — BP 124/86 | Ht 63.5 in | Wt 205.0 lb

## 2012-08-19 DIAGNOSIS — Z01419 Encounter for gynecological examination (general) (routine) without abnormal findings: Secondary | ICD-10-CM

## 2012-08-19 DIAGNOSIS — N898 Other specified noninflammatory disorders of vagina: Secondary | ICD-10-CM

## 2012-08-19 DIAGNOSIS — D219 Benign neoplasm of connective and other soft tissue, unspecified: Secondary | ICD-10-CM

## 2012-08-19 DIAGNOSIS — Z833 Family history of diabetes mellitus: Secondary | ICD-10-CM

## 2012-08-19 LAB — CBC WITH DIFFERENTIAL/PLATELET
Basophils Relative: 0 % (ref 0–1)
Eosinophils Absolute: 0 10*3/uL (ref 0.0–0.7)
Hemoglobin: 13.6 g/dL (ref 12.0–15.0)
MCH: 29.4 pg (ref 26.0–34.0)
MCHC: 33.7 g/dL (ref 30.0–36.0)
Monocytes Absolute: 0.5 10*3/uL (ref 0.1–1.0)
Monocytes Relative: 10 % (ref 3–12)
Neutrophils Relative %: 55 % (ref 43–77)

## 2012-08-19 LAB — WET PREP FOR TRICH, YEAST, CLUE
Trich, Wet Prep: NONE SEEN
Yeast Wet Prep HPF POC: NONE SEEN

## 2012-08-19 MED ORDER — LEVOTHYROXINE SODIUM 125 MCG PO TABS: 125.0000 ug | ORAL_TABLET | Freq: Every day | ORAL | Status: DC

## 2012-08-19 MED ORDER — METRONIDAZOLE 0.75 % VA GEL: VAGINAL | Status: DC

## 2012-08-19 NOTE — Patient Instructions (Addendum)

## 2012-08-19 NOTE — Progress Notes (Addendum)
Suzanne Beasley 07/30/1970 [id number]    History:    The patient presents for annual exam.  Monthly 7 day cycles using no contraception, pregnancy okay. History of infertility/PCO S, femara for 3 cycles in 2008/10/30. Has lost 40 pounds in the past 2 years. History of ascus in 10/31/02 with normal Paps after. Hypothyroid. History of a myomectomy Oct 30, 2009. Hysterosalpingogram with questionable right tube blocked/2009. Husband had low sperm count, he has since died. One partner greater than one year/negative STD screen.    Past medical history, past surgical history, family history and social history were all reviewed and documented in the EPIC chart. Works in [employer]. First husband died of cardiomyopathy 10-31-02. Father diabetes died from heart disease.   ROS:  A  ROS was performed and pertinent positives and negatives are included in the history.  Exam:  Filed Vitals:   08/19/12 1210  BP: 124/86    General appearance:  Normal Head/Neck:  Normal, without cervical or supraclavicular adenopathy. Thyroid:  Symmetrical, normal in size, without palpable masses or nodularity. Respiratory  Effort:  Normal  Auscultation:  Clear without wheezing or rhonchi Cardiovascular  Auscultation:  Regular rate, without rubs, murmurs or gallops  Edema/varicosities:  Not grossly evident Abdominal  Soft,nontender, without masses, guarding or rebound.  Liver/spleen:  No organomegaly noted  Hernia:  None appreciated  Skin  Inspection:  Grossly normal  Palpation:  Grossly normal Neurologic/psychiatric  Orientation:  Normal with appropriate conversation.  Mood/affect:  Normal  Genitourinary    Breasts: Examined lying and sitting.     Right: Without masses, retractions, discharge or axillary adenopathy.     Left: Without masses, retractions, discharge or axillary adenopathy.   Inguinal/mons:  Normal without inguinal adenopathy  External genitalia:  Normal  BUS/Urethra/Skene's glands:  Normal  Bladder:  Normal  Vagina:   Wet prep positive for amines, clues, TNTC bacteria.  Cervix:  Normal  Uterus:   normal in size, shape and contour.  Midline and mobile  Adnexa/parametria:     Rt: Without masses or tenderness.   Lt: Without masses or tenderness.  Anus and perineum: Normal  Digital rectal exam: Normal sphincter tone without palpated masses or tenderness  Assessment/Plan:  42 y.o. WBF. G0 for annual exam with complaint of vaginal discharge.  Bacteria vaginosis PCOS Obesity hypothyroid  Plan: MetroGel vaginal cream 1 applicator at bedtime x5, alcohol precautions reviewed. SBE's, continue regular exercise, calcium rich diet, MVI daily encouraged. Fertility discussed, repeat hysterosalpingogram, semen analysis, and schedule fertility management with Dr. Audie Box to discuss.. Synthroid 125 mcg by mouth daily prescription, proper use given and reviewed. CBC, glucose, TSH, UA, Pap normal Oct 31, 2011, new screening guidelines reviewed.   YOUNG,NANCY J WHNP, 1:10 PM 08/19/2012

## 2012-08-20 LAB — GLUCOSE, RANDOM: Glucose, Bld: 99 mg/dL (ref 70–99)

## 2012-08-20 LAB — URINALYSIS W MICROSCOPIC + REFLEX CULTURE
Bacteria, UA: NONE SEEN
Casts: NONE SEEN
Glucose, UA: NEGATIVE mg/dL
Hgb urine dipstick: NEGATIVE
Ketones, ur: NEGATIVE mg/dL
pH: 6.5 (ref 5.0–8.0)

## 2012-08-20 LAB — TSH: TSH: 0.171 u[IU]/mL — ABNORMAL LOW (ref 0.350–4.500)

## 2012-08-21 ENCOUNTER — Other Ambulatory Visit: Payer: Self-pay | Admitting: Women's Health

## 2012-08-21 DIAGNOSIS — E039 Hypothyroidism, unspecified: Secondary | ICD-10-CM

## 2012-08-21 MED ORDER — LEVOTHYROXINE SODIUM 112 MCG PO TABS: ORAL_TABLET | ORAL | Status: DC

## 2012-08-27 ENCOUNTER — Telehealth: Payer: Self-pay | Admitting: *Deleted

## 2012-08-27 MED ORDER — FLUCONAZOLE 200 MG PO TABS: 200.0000 mg | ORAL_TABLET | Freq: Every day | ORAL | Status: DC

## 2012-08-27 NOTE — Telephone Encounter (Signed)
Left the below on pt voicemail. 

## 2012-08-27 NOTE — Telephone Encounter (Signed)
Recommend Diflucan 200 mg daily x3 days. That should be sufficient.

## 2012-08-27 NOTE — Telephone Encounter (Signed)
You are back up MD, pt was seen on 08/19/12 given rx for Metrogel now c/o yeast infection vaginal itching and discharge. Pt asked if you would be will to give her the 5 day dose of diflucan given  on 05/31/12 OV . Please advise

## 2012-09-05 ENCOUNTER — Encounter: Payer: BC Managed Care – PPO | Admitting: Women's Health

## 2012-10-29 ENCOUNTER — Encounter: Payer: Self-pay | Admitting: Gynecology

## 2012-10-29 ENCOUNTER — Ambulatory Visit (INDEPENDENT_AMBULATORY_CARE_PROVIDER_SITE_OTHER): Payer: Commercial Managed Care - PPO | Admitting: Gynecology

## 2012-10-29 DIAGNOSIS — B9689 Other specified bacterial agents as the cause of diseases classified elsewhere: Secondary | ICD-10-CM

## 2012-10-29 DIAGNOSIS — L293 Anogenital pruritus, unspecified: Secondary | ICD-10-CM

## 2012-10-29 DIAGNOSIS — E039 Hypothyroidism, unspecified: Secondary | ICD-10-CM

## 2012-10-29 DIAGNOSIS — N898 Other specified noninflammatory disorders of vagina: Secondary | ICD-10-CM

## 2012-10-29 DIAGNOSIS — A499 Bacterial infection, unspecified: Secondary | ICD-10-CM

## 2012-10-29 DIAGNOSIS — N760 Acute vaginitis: Secondary | ICD-10-CM

## 2012-10-29 LAB — WET PREP FOR TRICH, YEAST, CLUE: Trich, Wet Prep: NONE SEEN

## 2012-10-29 MED ORDER — FLUCONAZOLE 200 MG PO TABS: 200.0000 mg | ORAL_TABLET | Freq: Every day | ORAL | Status: DC

## 2012-10-29 MED ORDER — METRONIDAZOLE 500 MG PO TABS: 500.0000 mg | ORAL_TABLET | Freq: Two times a day (BID) | ORAL | Status: DC

## 2012-10-29 NOTE — Patient Instructions (Signed)
Take metronidazole pills twice daily for 7 days, avoid alcohol while taking. Take Diflucan pill one day then skip a day and take it the third day (2 pills total).

## 2012-10-29 NOTE — Progress Notes (Signed)
Patient presents with history of vaginal itching and discharge. No real odor. Does have history of both bacterial vaginosis and yeast vulvovaginitis this in the past. Symptoms have been for the last 2 weeks. Did use miconazole cream she had at home times several applications.  Exam was Suzanne Beasley External BUS vagina with white cakey discharge. Cervix normal. Uterus grossly normal in size midline mobile nontender. Adnexa without masses or tenderness  Assessment and plan: 1. Vaginal discharge with itching. Wet prep consistent with bacterial vaginosis. Question absence of yeast due to partial treatment with miconazole cream. Will cover with Diflucan 200 mg x2 doses every other day and Flagyl 500 mg twice a day x7 days, alcohol avoidance reviewed. Followup if symptoms persist, worsen or recur. 2. Hypothyroid. Patient on her last pill of Synthroid. Needs to have TSH checked before refill per Harriett Sine. TSH drawn today. Patient will follow up for results several days and refill of her Synthroid

## 2012-10-30 ENCOUNTER — Other Ambulatory Visit: Payer: Self-pay | Admitting: Gynecology

## 2012-10-30 MED ORDER — LEVOTHYROXINE SODIUM 100 MCG PO TABS: 100.0000 ug | ORAL_TABLET | Freq: Every day | ORAL | Status: DC

## 2013-01-08 ENCOUNTER — Ambulatory Visit (INDEPENDENT_AMBULATORY_CARE_PROVIDER_SITE_OTHER): Payer: Commercial Managed Care - PPO | Admitting: Women's Health

## 2013-01-08 ENCOUNTER — Encounter: Payer: Self-pay | Admitting: Women's Health

## 2013-01-08 DIAGNOSIS — D219 Benign neoplasm of connective and other soft tissue, unspecified: Secondary | ICD-10-CM

## 2013-01-08 DIAGNOSIS — N898 Other specified noninflammatory disorders of vagina: Secondary | ICD-10-CM

## 2013-01-08 DIAGNOSIS — N760 Acute vaginitis: Secondary | ICD-10-CM

## 2013-01-08 DIAGNOSIS — D259 Leiomyoma of uterus, unspecified: Secondary | ICD-10-CM

## 2013-01-08 DIAGNOSIS — A499 Bacterial infection, unspecified: Secondary | ICD-10-CM

## 2013-01-08 DIAGNOSIS — L293 Anogenital pruritus, unspecified: Secondary | ICD-10-CM

## 2013-01-08 DIAGNOSIS — B9689 Other specified bacterial agents as the cause of diseases classified elsewhere: Secondary | ICD-10-CM

## 2013-01-08 DIAGNOSIS — E039 Hypothyroidism, unspecified: Secondary | ICD-10-CM

## 2013-01-08 LAB — TSH: TSH: 5.821 u[IU]/mL — ABNORMAL HIGH (ref 0.350–4.500)

## 2013-01-08 MED ORDER — FLUCONAZOLE 150 MG PO TABS: 150.0000 mg | ORAL_TABLET | Freq: Once | ORAL | Status: DC

## 2013-01-08 MED ORDER — METRONIDAZOLE 500 MG PO TABS: 500.0000 mg | ORAL_TABLET | Freq: Two times a day (BID) | ORAL | Status: DC

## 2013-01-08 NOTE — Patient Instructions (Addendum)
Bacterial Vaginosis Bacterial vaginosis (BV) is a vaginal infection where the normal balance of bacteria in the vagina is disrupted. The normal balance is then replaced by an overgrowth of certain bacteria. There are several different kinds of bacteria that can cause BV. BV is the most common vaginal infection in women of childbearing age. CAUSES   The cause of BV is not fully understood. BV develops when there is an increase or imbalance of harmful bacteria.  Some activities or behaviors can upset the normal balance of bacteria in the vagina and put women at increased risk including:  Having a new sex partner or multiple sex partners.  Douching.  Using an intrauterine device (IUD) for contraception.  It is not clear what role sexual activity plays in the development of BV. However, women that have never had sexual intercourse are rarely infected with BV. Women do not get BV from toilet seats, bedding, swimming pools or from touching objects around them.  SYMPTOMS   Grey vaginal discharge.  A fish-like odor with discharge, especially after sexual intercourse.  Itching or burning of the vagina and vulva.  Burning or pain with urination.  Some women have no signs or symptoms at all. DIAGNOSIS  Your caregiver must examine the vagina for signs of BV. Your caregiver will perform lab tests and look at the sample of vaginal fluid through a microscope. They will look for bacteria and abnormal cells (clue cells), a pH test higher than 4.5, and a positive amine test all associated with BV.  RISKS AND COMPLICATIONS   Pelvic inflammatory disease (PID).  Infections following gynecology surgery.  Developing HIV.  Developing herpes virus. TREATMENT  Sometimes BV will clear up without treatment. However, all women with symptoms of BV should be treated to avoid complications, especially if gynecology surgery is planned. Female partners generally do not need to be treated. However, BV may spread  between female sex partners so treatment is helpful in preventing a recurrence of BV.   BV may be treated with antibiotics. The antibiotics come in either pill or vaginal cream forms. Either can be used with nonpregnant or pregnant women, but the recommended dosages differ. These antibiotics are not harmful to the baby.  BV can recur after treatment. If this happens, a second round of antibiotics will often be prescribed.  Treatment is important for pregnant women. If not treated, BV can cause a premature delivery, especially for a pregnant woman who had a premature birth in the past. All pregnant women who have symptoms of BV should be checked and treated.  For chronic reoccurrence of BV, treatment with a type of prescribed gel vaginally twice a week is helpful. HOME CARE INSTRUCTIONS   Finish all medication as directed by your caregiver.  Do not have sex until treatment is completed.  Tell your sexual partner that you have a vaginal infection. They should see their caregiver and be treated if they have problems, such as a mild rash or itching.  Practice safe sex. Use condoms. Only have 1 sex partner. PREVENTION  Basic prevention steps can help reduce the risk of upsetting the natural balance of bacteria in the vagina and developing BV:  Do not have sexual intercourse (be abstinent).  Do not douche.  Use all of the medicine prescribed for treatment of BV, even if the signs and symptoms go away.  Tell your sex partner if you have BV. That way, they can be treated, if needed, to prevent reoccurrence. SEEK MEDICAL CARE IF:     Your symptoms are not improving after 3 days of treatment.  You have increased discharge, pain, or fever. MAKE SURE YOU:   Understand these instructions.  Will watch your condition.  Will get help right away if you are not doing well or get worse. FOR MORE INFORMATION  Division of STD Prevention (DSTDP), Centers for Disease Control and Prevention:  www.cdc.gov/std American Social Health Association (ASHA): www.ashastd.org  Document Released: 05/22/2005 Document Revised: 08/14/2011 Document Reviewed: 11/12/2008 [employer] Patient Information 2014 [employer].  

## 2013-01-08 NOTE — Progress Notes (Signed)
Patient ID: Suzanne Beasley, female   DOB: 1971-01-19, 42 y.o.   MRN: [id number] Presents with complaint of vaginal discharge with odor and mild itching. Denies abdominal pain, fever, or urinary symptoms. Hypothyroid, Synthroid adjusted 2 months ago 100 mcg daily. History of fibroids with myomectomy in 2011. States cycles becoming more regular but with increased cramps. Has lost 15 pounds since annual exam in March with diet and exercise.   Exam: Appears well, external genitalia within normal limits, speculum exam scant watery white discharge with odor noted, wet prep positive for amines, clue cells, bacteria. Bimanual uterus bulky 8 week size, nontender.  Bacteria vaginosis Hypothyroid Fibroid uterus  Plan: Flagyl 500 twice daily for 7 days, alcohol precautions reviewed, Diflucan 150. one dose per request states has itching after taking Flagyl in the past. TSH, continue 100 mcg daily. Ultrasound, will schedule after next cycle. Motrin as needed for dysmenorrhea.

## 2013-01-09 ENCOUNTER — Other Ambulatory Visit: Payer: Self-pay | Admitting: Women's Health

## 2013-01-09 MED ORDER — LEVOTHYROXINE SODIUM 112 MCG PO TABS: 112.0000 ug | ORAL_TABLET | Freq: Every day | ORAL | Status: DC

## 2013-01-27 ENCOUNTER — Telehealth: Payer: Self-pay | Admitting: *Deleted

## 2013-01-27 NOTE — Telephone Encounter (Signed)
Pt scheduled for vaginal ultrasound on Wednesday, pt cycle has started, pt would like to know if she should keep appointment or reschedule? Please advise

## 2013-01-27 NOTE — Telephone Encounter (Signed)
Best  to reschedule to early next week.

## 2013-01-28 NOTE — Telephone Encounter (Signed)
Pt informed with the below note, transferred to appointment desk to reshedule

## 2013-01-29 ENCOUNTER — Ambulatory Visit: Payer: Commercial Managed Care - PPO | Admitting: Women's Health

## 2013-01-29 ENCOUNTER — Other Ambulatory Visit: Payer: Commercial Managed Care - PPO

## 2013-02-05 ENCOUNTER — Ambulatory Visit (INDEPENDENT_AMBULATORY_CARE_PROVIDER_SITE_OTHER): Payer: Commercial Managed Care - PPO

## 2013-02-05 ENCOUNTER — Ambulatory Visit (INDEPENDENT_AMBULATORY_CARE_PROVIDER_SITE_OTHER): Payer: Commercial Managed Care - PPO | Admitting: Women's Health

## 2013-02-05 ENCOUNTER — Encounter: Payer: Self-pay | Admitting: Women's Health

## 2013-02-05 DIAGNOSIS — N854 Malposition of uterus: Secondary | ICD-10-CM

## 2013-02-05 DIAGNOSIS — D259 Leiomyoma of uterus, unspecified: Secondary | ICD-10-CM

## 2013-02-05 DIAGNOSIS — D219 Benign neoplasm of connective and other soft tissue, unspecified: Secondary | ICD-10-CM

## 2013-02-05 DIAGNOSIS — E282 Polycystic ovarian syndrome: Secondary | ICD-10-CM

## 2013-02-05 NOTE — Patient Instructions (Addendum)
Polycystic Ovarian Syndrome  Polycystic ovarian syndrome is a condition with a number of problems. One problem is with the ovaries. The ovaries are organs located in the female pelvis, on each side of the uterus. Usually, during the menstrual cycle, an egg is released from 1 ovary every month. This is called ovulation. When the egg is fertilized, it goes into the womb (uterus), which allows for the growth of a baby. The egg travels from the ovary through the fallopian tube to the uterus. The ovaries also make the hormones estrogen and progesterone. These hormones help the development of a woman's breasts, body shape, and body hair. They also regulate the menstrual cycle and pregnancy.  Sometimes, cysts form in the ovaries. A cyst is a fluid-filled sac. On the ovary, different types of cysts can form. The most common type of ovarian cyst is called a functional or ovulation cyst. It is normal, and often forms during the normal menstrual cycle. Each month, a woman's ovaries grow tiny cysts that hold the eggs. When an egg is fully grown, the sac breaks open. This releases the egg. Then, the sac which released the egg from the ovary dissolves. In one type of functional cyst, called a follicle cyst, the sac does not break open to release the egg. It may actually continue to grow. This type of cyst usually disappears within 1 to 3 months.   One type of cyst problem with the ovaries is called Polycystic Ovarian Syndrome (PCOS). In this condition, many follicle cysts form, but do not rupture and produce an egg. This health problem can affect the following:  · Menstrual cycle.  · Heart.  · Obesity.  · Cancer of the uterus.  · Fertility.  · Blood vessels.  · Hair growth (face and body) or baldness.  · Hormones.  · Appearance.  · High blood pressure.  · Stroke.  · Insulin production.  · Inflammation of the liver.  · Elevated blood cholesterol and triglycerides.  CAUSES   · No one knows the exact cause of PCOS.  · Women with  PCOS often have a mother or sister with PCOS. There is not yet enough proof to say this is inherited.  · Many women with PCOS have a weight problem.  · Researchers are looking at the relationship between PCOS and the body's ability to make insulin. Insulin is a hormone that regulates the change of sugar, starches, and other food into energy for the body's use, or for storage. Some women with PCOS make too much insulin. It is possible that the ovaries react by making too many female hormones, called androgens. This can lead to acne, excessive hair growth, weight gain, and ovulation problems.  · Too much production of luteinizing hormone (LH) from the pituitary gland in the brain stimulates the ovary to produce too much female hormone (androgen).  SYMPTOMS   · Infrequent or no menstrual periods, and/or irregular bleeding.  · Inability to get pregnant (infertility), because of not ovulating.  · Increased growth of hair on the face, chest, stomach, back, thumbs, thighs, or toes.  · Acne, oily skin, or dandruff.  · Pelvic pain.  · Weight gain or obesity, usually carrying extra weight around the waist.  · Type 2 diabetes (this is the diabetes that usually does not need insulin).  · High cholesterol.  · High blood pressure.  · Female-pattern baldness or thinning hair.  · Patches of thickened and dark brown or black skin on the neck, arms, breasts,   or thighs.  · Skin tags, or tiny excess flaps of skin, in the armpits or neck area.  · Sleep apnea (excessive snoring and breathing stops at times while asleep).  · Deepening of the voice.  · Gestational diabetes when pregnant.  · Increased risk of miscarriage with pregnancy.  DIAGNOSIS   There is no single test to diagnose PCOS.   · Your caregiver will:  · Take a medical history.  · Perform a pelvic exam.  · Perform an ultrasound.  · Check your female and female hormone levels.  · Measure glucose or sugar levels in the blood.  · Do other blood tests.  · If you are producing too many  female hormones, your caregiver will make sure it is from PCOS. At the physical exam, your caregiver will want to evaluate the areas of increased hair growth. Try to allow natural hair growth for a few days before the visit.  · During a pelvic exam, the ovaries may be enlarged or swollen by the increased number of small cysts. This can be seen more easily by vaginal ultrasound or screening, to examine the ovaries and lining of the uterus (endometrium) for cysts. The uterine lining may become thicker, if there has not been a regular period.  TREATMENT   Because there is no cure for PCOS, it needs to be managed to prevent problems. Treatments are based on your symptoms. Treatment is also based on whether you want to have a baby or whether you need contraception.   Treatment may include:  · Progesterone hormone, to start a menstrual period.  · Birth control pills, to make you have regular menstrual periods.  · Medicines to make you ovulate, if you want to get pregnant.  · Medicines to control your insulin.  · Medicine to control your blood pressure.  · Medicine and diet, to control your high cholesterol and triglycerides in your blood.  · Surgery, making small holes in the ovary, to decrease the amount of female hormone production. This is done through a long, lighted tube (laparoscope), placed into the pelvis through a tiny incision in the lower abdomen.  Your caregiver will go over some of the choices with you.  WOMEN WITH PCOS HAVE THESE CHARACTERISTICS:  · High levels of female hormones called androgens.  · An irregular or no menstrual cycle.  · May have many small cysts in their ovaries.  PCOS is the most common hormonal reproductive problem in women of childbearing age.  WHY DO WOMEN WITH PCOS HAVE TROUBLE WITH THEIR MENSTRUAL CYCLE?  Each month, about 20 eggs start to mature in the ovaries. As one egg grows and matures, the follicle breaks open to release the egg, so it can travel through the fallopian tube for  fertilization. When the single egg leaves the follicle, ovulation takes place. In women with PCOS, the ovary does not make all of the hormones it needs for any of the eggs to fully mature. They may start to grow and accumulate fluid, but no one egg becomes large enough. Instead, some may remain as cysts. Since no egg matures or is released, ovulation does not occur and the hormone progesterone is not made. Without progesterone, a woman's menstrual cycle is irregular or absent. Also, the cysts produce female hormones, which continue to prevent ovulation.   Document Released: 09/15/2004 Document Revised: 08/14/2011 Document Reviewed: 04/09/2009  [employer]® Patient Information ©2014 [employer].

## 2013-02-05 NOTE — Progress Notes (Signed)
Patient ID: Suzanne Beasley, female   DOB: [birth year], 42 y.o.   MRN: [id number] Presents for ultrasound, myomectomy 2011, monthly cycle, history of PCO S. Sexually active using no contraception pregnancy okay. 2009 Hysterosalpingogram right fallopian tube blocked. History of infertility with ex husband/ medications and IUI's.   Ultrasound: Cervical anteflexed uterus. Homogeneous echo, no fibroids noted. Tri layered endometrial cavity. Right and left ovary numerous follicles perimeter of ovaries less than 5 mm. Left ovary dominant follicle 20 mm. Negative cul-de-sac.  PCO S.  Plan: Discuss fertility timing/ovulation. Return to office with missed cycle for viability ultrasound.

## 2013-02-19 ENCOUNTER — Telehealth: Payer: Self-pay | Admitting: *Deleted

## 2013-02-19 NOTE — Telephone Encounter (Signed)
Pt has OV scheduled tomorrow for yeast infection took 100 mg diflucan and still no relief. Pt asked if she should keep schedule appointment, I left on voicemail to keep this OV.

## 2013-02-20 ENCOUNTER — Encounter: Payer: Self-pay | Admitting: Women's Health

## 2013-02-20 ENCOUNTER — Ambulatory Visit (INDEPENDENT_AMBULATORY_CARE_PROVIDER_SITE_OTHER): Payer: Commercial Managed Care - PPO | Admitting: Women's Health

## 2013-02-20 DIAGNOSIS — B3731 Acute candidiasis of vulva and vagina: Secondary | ICD-10-CM

## 2013-02-20 DIAGNOSIS — N898 Other specified noninflammatory disorders of vagina: Secondary | ICD-10-CM

## 2013-02-20 DIAGNOSIS — B373 Candidiasis of vulva and vagina: Secondary | ICD-10-CM

## 2013-02-20 LAB — WET PREP FOR TRICH, YEAST, CLUE

## 2013-02-20 MED ORDER — TERCONAZOLE 0.8 % VA CREA: 1.0000 | TOPICAL_CREAM | Freq: Every day | VAGINAL | Status: DC

## 2013-02-20 NOTE — Progress Notes (Signed)
Patient ID: Suzanne Beasley, female   DOB: [birth year], 42 y.o.   MRN: [id number] Resents with complaint of questionable yeast. Had used Diflucan 150 tablet and use MetroGel for 2 nights this week. States continues with a white discharge, mostly external itching, denies odor, urinary symptoms, abdominal pain, or fever. Same partner, monthly cycle.  Exam: Appears well, external genitalia slightly erythematous, speculum exam moderate amount of gel-like material noted, wet prep negative. Bimanual no CMT or adnexal fullness or tenderness. History of a fibroid uterus.  Self treated vaginal discharge  Plan: Terazol 3 use externally for vaginal itching. Instructed to call if no relief of symptoms. Reviewed best not to use medications prior to office visit.

## 2013-03-12 ENCOUNTER — Other Ambulatory Visit: Payer: Self-pay | Admitting: Women's Health

## 2013-04-29 ENCOUNTER — Telehealth: Payer: Self-pay | Admitting: *Deleted

## 2013-04-29 NOTE — Telephone Encounter (Signed)
Pt called c/o cycle being 11 days late, LMP:03/12/13, will take a UPT in am and call me back with results.

## 2013-04-30 ENCOUNTER — Other Ambulatory Visit: Payer: Self-pay | Admitting: Women's Health

## 2013-04-30 DIAGNOSIS — N912 Amenorrhea, unspecified: Secondary | ICD-10-CM

## 2013-04-30 NOTE — Telephone Encounter (Signed)
Patient called to say she is calling back to report that her home UPT this morning was negative. She had spoken with [facility] yesterday. What to recommend?

## 2013-04-30 NOTE — Telephone Encounter (Signed)
Telephone call, states cycles were used to 28-30 days. Will call office to schedule lab appointment for blood pregnancy test and if negative will withdrawal with Provera.

## 2013-06-10 ENCOUNTER — Telehealth: Payer: Self-pay | Admitting: *Deleted

## 2013-06-10 DIAGNOSIS — R7989 Other specified abnormal findings of blood chemistry: Secondary | ICD-10-CM

## 2013-06-10 NOTE — Telephone Encounter (Signed)
Pt called requesting TSH level checked. I left message for pt to call.

## 2013-06-11 NOTE — Telephone Encounter (Signed)
Pt will need refill on synthroid 112 mcg, I will tell her to cancel lab appointment for 06/13/13. I will send refill in.

## 2013-06-11 NOTE — Telephone Encounter (Signed)
Pt will be coming on 06/13/13 to have TSH level rechecked. Pt mentioned that her LMP 05/01/13 no period in [place]. pt said this would be the second time this has happened. Pt asked if she should wait to see if cycle starts? Please advise

## 2013-06-11 NOTE — Telephone Encounter (Signed)
Pt informed, pt had TSH level checked at her job and decreased her back to synthroid 100 mcg, pt will call and get refill from job and come back for the below at the end of the month.

## 2013-06-11 NOTE — Telephone Encounter (Signed)
Please call, have her, come to office for TSH at end of month, if still no cycle will check hCG qual. (She is using no contraception, history of PCO S., pregnancy okay.)

## 2013-06-13 ENCOUNTER — Other Ambulatory Visit: Payer: Commercial Managed Care - PPO

## 2013-08-08 ENCOUNTER — Encounter: Payer: Self-pay | Admitting: Women's Health

## 2013-08-08 ENCOUNTER — Ambulatory Visit (INDEPENDENT_AMBULATORY_CARE_PROVIDER_SITE_OTHER): Payer: Commercial Managed Care - PPO | Admitting: Women's Health

## 2013-08-08 DIAGNOSIS — A499 Bacterial infection, unspecified: Secondary | ICD-10-CM

## 2013-08-08 DIAGNOSIS — N898 Other specified noninflammatory disorders of vagina: Secondary | ICD-10-CM

## 2013-08-08 DIAGNOSIS — B9689 Other specified bacterial agents as the cause of diseases classified elsewhere: Secondary | ICD-10-CM

## 2013-08-08 DIAGNOSIS — N760 Acute vaginitis: Secondary | ICD-10-CM

## 2013-08-08 LAB — WET PREP FOR TRICH, YEAST, CLUE
TRICH WET PREP: NONE SEEN
WBC, Wet Prep HPF POC: NONE SEEN
Yeast Wet Prep HPF POC: NONE SEEN

## 2013-08-08 MED ORDER — FLUCONAZOLE 100 MG PO TABS: ORAL_TABLET | ORAL | Status: DC

## 2013-08-08 MED ORDER — METRONIDAZOLE 0.75 % VA GEL
VAGINAL | Status: DC
Start: 2013-08-08 — End: 2013-09-04

## 2013-08-08 NOTE — Progress Notes (Signed)
Patient ID: Suzanne Beasley, female   DOB: 08-09-1970, 43 y.o.   MRN: [id number] Presents with complaint of vaginal itching and unprotected intercourse with new partner. Denies urinary symptoms, abdominal pain or fever. History of recurrent yeast. Monthly cycle using no contraception/ infertility history, pregnancy okay.  Exam: Appears well. External genitalia within normal limits. Speculum exam vaginal walls are erythematous, moderate amount of milky adherent discharge noted, wet prep positive for rare clue cells. GC/Chlamydia culture taken. Bimanual no CMT or adnexal fullness or tenderness.  Symptomatic yeast Bacteria vaginosis  Plan: Keep scheduled annual exam visit for next month will check HIV, hepatitis or RPR. MetroGel vaginal cream 1 applicator at bedtime x5, alcohol precautions reviewed. Diflucan 100 2 tablets today, instructed to call if no relief of symptoms. Aware of yeast prevention.

## 2013-08-08 NOTE — Patient Instructions (Signed)
Bacterial Vaginosis Bacterial vaginosis is an infection of the vagina. It happens when too many of certain germs (bacteria) grow in the vagina. HOME CARE  Take your medicine as told by your doctor.  Finish your medicine even if you start to feel better.  Do not have sex until you finish your medicine and are better.  Tell your sex partner that you have an infection. They should see their doctor for treatment.  Practice safe sex. Use condoms. Have only one sex partner. GET HELP IF:  You are not getting better after 3 days of treatment.  You have more grey fluid (discharge) coming from your vagina than before.  You have more pain than before.  You have a fever. MAKE SURE YOU:   Understand these instructions.  Will watch your condition.  Will get help right away if you are not doing well or get worse. Document Released: 02/29/2008 Document Revised: 03/12/2013 Document Reviewed: 01/01/2013 [facility] Patient Information 2014 [place].

## 2013-08-10 LAB — GC/CHLAMYDIA PROBE AMP
CT Probe RNA: NEGATIVE
GC PROBE AMP APTIMA: NEGATIVE

## 2013-08-19 ENCOUNTER — Telehealth: Payer: Self-pay | Admitting: *Deleted

## 2013-08-19 NOTE — Telephone Encounter (Signed)
Pt calling to follow up from OV 08/08/13 given Diflucan 200 mg Diflucan 100 2 tablets today, 1 weekly as needed has about 10 pills left. Still c/o vaginal itching and white discharge. Pt asked what else can she do? Can she take 1 pill daily? Please advise

## 2013-08-19 NOTE — Telephone Encounter (Signed)
Telephone call, will try  Diflucan 100 daily for 3 days to see if problem resolves. Instructed to call if no relief.

## 2013-09-04 ENCOUNTER — Ambulatory Visit (INDEPENDENT_AMBULATORY_CARE_PROVIDER_SITE_OTHER): Payer: Commercial Managed Care - PPO | Admitting: Women's Health

## 2013-09-04 ENCOUNTER — Encounter: Payer: Self-pay | Admitting: Women's Health

## 2013-09-04 ENCOUNTER — Other Ambulatory Visit (HOSPITAL_COMMUNITY)
Admission: RE | Admit: 2013-09-04 | Discharge: 2013-09-04 | Disposition: A | Discharge status: Home / Self Care | Payer: Commercial Managed Care - PPO | Source: Ambulatory Visit | Attending: Gynecology | Admitting: Gynecology

## 2013-09-04 VITALS — BP 120/80 | Ht 63.5 in | Wt 185.0 lb

## 2013-09-04 DIAGNOSIS — Z833 Family history of diabetes mellitus: Secondary | ICD-10-CM

## 2013-09-04 DIAGNOSIS — Z01419 Encounter for gynecological examination (general) (routine) without abnormal findings: Secondary | ICD-10-CM | POA: Insufficient documentation

## 2013-09-04 DIAGNOSIS — Z13220 Encounter for screening for lipoid disorders: Secondary | ICD-10-CM

## 2013-09-04 DIAGNOSIS — E039 Hypothyroidism, unspecified: Secondary | ICD-10-CM

## 2013-09-04 LAB — CBC WITH DIFFERENTIAL/PLATELET
Basophils Absolute: 0 10*3/uL (ref 0.0–0.1)
Basophils Relative: 1 % (ref 0–1)
Eosinophils Absolute: 0 10*3/uL (ref 0.0–0.7)
Eosinophils Relative: 1 % (ref 0–5)
HEMATOCRIT: 36.5 % (ref 36.0–46.0)
HEMOGLOBIN: 12.5 g/dL (ref 12.0–15.0)
LYMPHS ABS: 2 10*3/uL (ref 0.7–4.0)
Lymphocytes Relative: 52 % — ABNORMAL HIGH (ref 12–46)
MCH: 29.3 pg (ref 26.0–34.0)
MCHC: 34.2 g/dL (ref 30.0–36.0)
MCV: 85.5 fL (ref 78.0–100.0)
MONO ABS: 0.3 10*3/uL (ref 0.1–1.0)
Monocytes Relative: 9 % (ref 3–12)
NEUTROS ABS: 1.4 10*3/uL — AB (ref 1.7–7.7)
NEUTROS PCT: 37 % — AB (ref 43–77)
Platelets: 227 10*3/uL (ref 150–400)
RBC: 4.27 MIL/uL (ref 3.87–5.11)
RDW: 13.7 % (ref 11.5–15.5)
WBC: 3.8 10*3/uL — AB (ref 4.0–10.5)

## 2013-09-04 LAB — LIPID PANEL
CHOLESTEROL: 196 mg/dL (ref 0–200)
HDL: 62 mg/dL (ref >39)
LDL Cholesterol: 124 mg/dL — ABNORMAL HIGH (ref 0–99)
Total CHOL/HDL Ratio: 3.2 Ratio
Triglycerides: 52 mg/dL (ref <150)
VLDL: 10 mg/dL (ref 0–40)

## 2013-09-04 LAB — GLUCOSE, RANDOM: GLUCOSE: 92 mg/dL (ref 70–99)

## 2013-09-04 MED ORDER — LEVOTHYROXINE SODIUM 112 MCG PO TABS: 112.0000 ug | ORAL_TABLET | Freq: Every day | ORAL | Status: DC

## 2013-09-04 NOTE — Patient Instructions (Signed)

## 2013-09-04 NOTE — Progress Notes (Signed)
ARRYN TERRONES Sep 23, 1970 [id number]    History:    Presents for annual exam.  Monthly cycle not sexually active. History of PCO S. and infertility. 2009 HSG showed right fallopian tube blockage. 2004 ascus with normal Paps after. 2011 myomectomy. Hypothyroid on Synthroid. Has lost 60 pounds over the past 2 years with diet and exercise.  Past medical history, past surgical history, family history and social history were all reviewed and documented in the EPIC chart. Works in [place] husband died of cardiomyopathy. Father diabetes and heart disease.  ROS:  A  ROS was performed and pertinent positives and negatives are included.  Exam:  Filed Vitals:   09/04/13 1410  BP: 120/80    General appearance:  Normal Thyroid:  Symmetrical, normal in size, without palpable masses or nodularity. Respiratory  Auscultation:  Clear without wheezing or rhonchi Cardiovascular  Auscultation:  Regular rate, without rubs, murmurs or gallops  Edema/varicosities:  Not grossly evident Abdominal  Soft,nontender, without masses, guarding or rebound.  Liver/spleen:  No organomegaly noted  Hernia:  None appreciated  Skin  Inspection:  Grossly normal   Breasts: Examined lying and sitting.     Right: Without masses, retractions, discharge or axillary adenopathy.     Left: Without masses, retractions, discharge or axillary adenopathy. Gentitourinary   Inguinal/mons:  Normal without inguinal adenopathy  External genitalia:  Normal  BUS/Urethra/Skene's glands:  Normal  Vagina:  Normal  Cervix:  Normal  Uterus:   normal in size, shape and contour.  Midline and mobile  Adnexa/parametria:     Rt: Without masses or tenderness.   Lt: Without masses or tenderness.  Anus and perineum: Normal  Digital rectal exam: Normal sphincter tone without palpated masses or tenderness  Assessment/Plan:  43 y.o.DWF G0  for annual exam.    Regular monthly cycle Infertility/PCO S./right fallopian tube blockage 2011  myomectomy Hypothyroid  Plan: SBE's, schedule annual screening mammogram, calcium rich diet, MVI daily. Contraception reviewed and declined, condoms encouraged when sexually active. Continue healthy diet, regular exercise for continued weight loss. Synthroid 112 mcg prescription, proper use given and reviewed. CBC, TSH, lipid panel, glucose, UA Pap. Pap normal 2013, new screening guidelines reviewed.  Huel Cote [facility], 5:12 PM 09/04/2013

## 2013-09-05 LAB — URINALYSIS W MICROSCOPIC + REFLEX CULTURE
BILIRUBIN URINE: NEGATIVE
CRYSTALS: NONE SEEN
Casts: NONE SEEN
GLUCOSE, UA: NEGATIVE mg/dL
Hgb urine dipstick: NEGATIVE
KETONES UR: NEGATIVE mg/dL
Leukocytes, UA: NEGATIVE
Nitrite: NEGATIVE
PROTEIN: NEGATIVE mg/dL
Specific Gravity, Urine: 1.012 (ref 1.005–1.030)
Urobilinogen, UA: 0.2 mg/dL (ref 0.0–1.0)
pH: 6 (ref 5.0–8.0)

## 2014-02-24 ENCOUNTER — Other Ambulatory Visit: Payer: Self-pay | Admitting: Women's Health

## 2014-02-24 MED ORDER — LEVOTHYROXINE SODIUM 125 MCG PO TABS: 125.0000 ug | ORAL_TABLET | Freq: Every day | ORAL | Status: DC

## 2014-02-24 NOTE — Progress Notes (Signed)
Telephone call, reviewed TSH 14.4 currently on Synthroid 112 will increase to Synthroid 125 daily and recheck TSH in 6 weeks. Vitamin D also low at 27, will add in 2000 IUs daily.

## 2014-04-28 ENCOUNTER — Telehealth: Payer: Self-pay | Admitting: *Deleted

## 2014-04-28 MED ORDER — LEVOTHYROXINE SODIUM 125 MCG PO TABS: 125.0000 ug | ORAL_TABLET | Freq: Every day | ORAL | Status: DC

## 2014-04-28 NOTE — Telephone Encounter (Signed)
Pt called requesting refill on synthroid 125 mcg.  rx sent

## 2014-05-05 ENCOUNTER — Ambulatory Visit (INDEPENDENT_AMBULATORY_CARE_PROVIDER_SITE_OTHER): Payer: Commercial Managed Care - PPO | Admitting: Women's Health

## 2014-05-05 ENCOUNTER — Encounter: Payer: Self-pay | Admitting: Women's Health

## 2014-05-05 VITALS — BP 124/80 | Ht 63.0 in

## 2014-05-05 DIAGNOSIS — B3731 Acute candidiasis of vulva and vagina: Secondary | ICD-10-CM

## 2014-05-05 DIAGNOSIS — B373 Candidiasis of vulva and vagina: Secondary | ICD-10-CM

## 2014-05-05 DIAGNOSIS — A499 Bacterial infection, unspecified: Secondary | ICD-10-CM

## 2014-05-05 DIAGNOSIS — B9689 Other specified bacterial agents as the cause of diseases classified elsewhere: Secondary | ICD-10-CM

## 2014-05-05 DIAGNOSIS — N898 Other specified noninflammatory disorders of vagina: Secondary | ICD-10-CM

## 2014-05-05 DIAGNOSIS — N760 Acute vaginitis: Secondary | ICD-10-CM

## 2014-05-05 LAB — WET PREP FOR TRICH, YEAST, CLUE: Trich, Wet Prep: NONE SEEN

## 2014-05-05 MED ORDER — FLUCONAZOLE 150 MG PO TABS: ORAL_TABLET | ORAL | Status: DC

## 2014-05-05 MED ORDER — METRONIDAZOLE 0.75 % VA GEL: VAGINAL | Status: DC

## 2014-05-05 NOTE — Progress Notes (Signed)
Patient ID: Suzanne Beasley, female   DOB: [birth year], 43 y.o.   MRN: [id number] Presents with complaint of vaginal itching, questionable yeast infection. Used one applicator of MetroGel, originally thought it was BV. Has had problems with recurrent BV and yeast. New partner. Monthly cycle using no contraception history of infertility. Denies abdominal pain, urinary symptoms or fever.  Exam: Appears well. External genitalia within normal limits, speculum exam scant amount of a white discharge noted wet prep positive for few yeast, few clue cells, TNTC bacteria. Bimanual no CMT or tenderness. GC/Chlamydia culture taken.  Yeast vaginitis BV Contraception management  Plan: Diflucan 150 by mouth today repeat in 3 days as needed. MetroGel vaginal cream 1 applicator for 2 nights, instructed to call if no relief of symptoms. Contraception options reviewed and declined, pregnancy okay. Continue vitamins daily.

## 2014-05-05 NOTE — Patient Instructions (Signed)

## 2014-05-06 LAB — GC/CHLAMYDIA PROBE AMP
CT Probe RNA: NEGATIVE
GC Probe RNA: NEGATIVE

## 2014-05-07 ENCOUNTER — Other Ambulatory Visit: Payer: Self-pay | Admitting: *Deleted

## 2014-05-07 DIAGNOSIS — R7989 Other specified abnormal findings of blood chemistry: Secondary | ICD-10-CM

## 2014-05-08 ENCOUNTER — Encounter (HOSPITAL_COMMUNITY): Payer: Self-pay | Admitting: Emergency Medicine

## 2014-05-08 ENCOUNTER — Other Ambulatory Visit: Payer: Self-pay | Admitting: Women's Health

## 2014-05-08 ENCOUNTER — Emergency Department (HOSPITAL_COMMUNITY): Payer: Commercial Managed Care - PPO

## 2014-05-08 ENCOUNTER — Emergency Department (HOSPITAL_COMMUNITY)
Admission: EM | Admit: 2014-05-08 | Discharge: 2014-05-08 | Disposition: A | Discharge status: Home / Self Care | Payer: Commercial Managed Care - PPO | Attending: Emergency Medicine | Admitting: Emergency Medicine

## 2014-05-08 DIAGNOSIS — E039 Hypothyroidism, unspecified: Secondary | ICD-10-CM | POA: Insufficient documentation

## 2014-05-08 DIAGNOSIS — Z91040 Latex allergy status: Secondary | ICD-10-CM | POA: Diagnosis not present

## 2014-05-08 DIAGNOSIS — Y998 Other external cause status: Secondary | ICD-10-CM | POA: Insufficient documentation

## 2014-05-08 DIAGNOSIS — R7989 Other specified abnormal findings of blood chemistry: Secondary | ICD-10-CM

## 2014-05-08 DIAGNOSIS — S161XXA Strain of muscle, fascia and tendon at neck level, initial encounter: Secondary | ICD-10-CM | POA: Insufficient documentation

## 2014-05-08 DIAGNOSIS — Z8742 Personal history of other diseases of the female genital tract: Secondary | ICD-10-CM | POA: Diagnosis not present

## 2014-05-08 DIAGNOSIS — Y92410 Unspecified street and highway as the place of occurrence of the external cause: Secondary | ICD-10-CM | POA: Insufficient documentation

## 2014-05-08 DIAGNOSIS — Y9389 Activity, other specified: Secondary | ICD-10-CM | POA: Insufficient documentation

## 2014-05-08 DIAGNOSIS — S4991XA Unspecified injury of right shoulder and upper arm, initial encounter: Secondary | ICD-10-CM | POA: Diagnosis not present

## 2014-05-08 DIAGNOSIS — S199XXA Unspecified injury of neck, initial encounter: Secondary | ICD-10-CM | POA: Diagnosis present

## 2014-05-08 DIAGNOSIS — Z79899 Other long term (current) drug therapy: Secondary | ICD-10-CM | POA: Insufficient documentation

## 2014-05-08 MED ORDER — IBUPROFEN 800 MG PO TABS: 800.0000 mg | ORAL_TABLET | Freq: Three times a day (TID) | ORAL | Status: DC

## 2014-05-08 MED ORDER — LEVOTHYROXINE SODIUM 137 MCG PO TABS: 137.0000 ug | ORAL_TABLET | Freq: Every day | ORAL | Status: DC

## 2014-05-08 MED ORDER — HYDROCODONE-ACETAMINOPHEN 5-325 MG PO TABS
2.0000 | ORAL_TABLET | Freq: Once | ORAL | Status: AC
  Administered 2014-05-08: 2 via ORAL

## 2014-05-08 MED ORDER — HYDROCODONE-ACETAMINOPHEN 5-325 MG PO TABS: 2.0000 | ORAL_TABLET | ORAL | Status: DC | PRN

## 2014-05-08 NOTE — ED Notes (Signed)
Bed: WTR8 Expected date:  Expected time:  Means of arrival:  Comments: [facility]

## 2014-05-08 NOTE — Discharge Instructions (Signed)
Cervical Sprain A cervical sprain is an injury in the neck in which the strong, fibrous tissues (ligaments) that connect your neck bones stretch or tear. Cervical sprains can range from mild to severe. Severe cervical sprains can cause the neck vertebrae to be unstable. This can lead to damage of the spinal cord and can result in serious nervous system problems. The amount of time it takes for a cervical sprain to get better depends on the cause and extent of the injury. Most cervical sprains heal in 1 to 3 weeks. CAUSES  Severe cervical sprains may be caused by:   Contact sport injuries (such as from football, rugby, wrestling, hockey, auto racing, gymnastics, diving, martial arts, or boxing).   Motor vehicle collisions.   Whiplash injuries. This is an injury from a sudden forward and backward whipping movement of the head and neck.  Falls.  Mild cervical sprains may be caused by:   Being in an awkward position, such as while cradling a telephone between your ear and shoulder.   Sitting in a chair that does not offer proper support.   Working at a poorly Landscape [employer] station.   Looking up or down for long periods of time.  SYMPTOMS   Pain, soreness, stiffness, or a burning sensation in the front, back, or sides of the neck. This discomfort may develop immediately after the injury or slowly, 24 hours or more after the injury.   Pain or tenderness directly in the middle of the back of the neck.   Shoulder or upper back pain.   Limited ability to move the neck.   Headache.   Dizziness.   Weakness, numbness, or tingling in the hands or arms.   Muscle spasms.   Difficulty swallowing or chewing.   Tenderness and swelling of the neck.  DIAGNOSIS  Most of the time your health care provider can diagnose a cervical sprain by taking your history and doing a physical exam. Your health care provider will ask about previous neck injuries and any known neck  problems, such as arthritis in the neck. X-rays may be taken to find out if there are any other problems, such as with the bones of the neck. Other tests, such as a CT scan or MRI, may also be needed.  TREATMENT  Treatment depends on the severity of the cervical sprain. Mild sprains can be treated with rest, keeping the neck in place (immobilization), and pain medicines. Severe cervical sprains are immediately immobilized. Further treatment is done to help with pain, muscle spasms, and other symptoms and may include:  Medicines, such as pain relievers, numbing medicines, or muscle relaxants.   Physical therapy. This may involve stretching exercises, strengthening exercises, and posture training. Exercises and improved posture can help stabilize the neck, strengthen muscles, and help stop symptoms from returning.  HOME CARE INSTRUCTIONS   Put ice on the injured area.   Put ice in a plastic bag.   Place a towel between your skin and the bag.   Leave the ice on for 15-20 minutes, 3-4 times a day.   If your injury was severe, you may have been given a cervical collar to wear. A cervical collar is a two-piece collar designed to keep your neck from moving while it heals.  Do not remove the collar unless instructed by your health care provider.  If you have long hair, keep it outside of the collar.  Ask your health care provider before making any adjustments to your collar. Minor  adjustments may be required over time to improve comfort and reduce pressure on your chin or on the back of your head.  Ifyou are allowed to remove the collar for cleaning or bathing, follow your health care provider's instructions on how to do so safely.  Keep your collar clean by wiping it with mild soap and water and drying it completely. If the collar you have been given includes removable pads, remove them every 1-2 days and hand wash them with soap and water. Allow them to air dry. They should be completely  dry before you wear them in the collar.  If you are allowed to remove the collar for cleaning and bathing, wash and dry the skin of your neck. Check your skin for irritation or sores. If you see any, tell your health care provider.  Do not drive while wearing the collar.   Only take over-the-counter or prescription medicines for pain, discomfort, or fever as directed by your health care provider.   Keep all follow-up appointments as directed by your health care provider.   Keep all physical therapy appointments as directed by your health care provider.   Make any needed adjustments to your workstation to promote good posture.   Avoid positions and activities that make your symptoms worse.   Warm up and stretch before being active to help prevent problems.  SEEK MEDICAL CARE IF:   Your pain is not controlled with medicine.   You are unable to decrease your pain medicine over time as planned.   Your activity level is not improving as expected.  SEEK IMMEDIATE MEDICAL CARE IF:   You develop any bleeding.  You develop stomach upset.  You have signs of an allergic reaction to your medicine.   Your symptoms get worse.   You develop new, unexplained symptoms.   You have numbness, tingling, weakness, or paralysis in any part of your body.  MAKE SURE YOU:   Understand these instructions.  Will watch your condition.  Will get help right away if you are not doing well or get worse. Document Released: 03/19/2007 Document Revised: 05/27/2013 Document Reviewed: 11/27/2012 [facility] Patient Information 2015 [place], [place]. This information is not intended to replace advice given to you by your health care provider. Make sure you discuss any questions you have with your health care provider.
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
## 2014-05-11 ENCOUNTER — Emergency Department (HOSPITAL_COMMUNITY)
Admission: EM | Admit: 2014-05-11 | Discharge: 2014-05-11 | Disposition: A | Discharge status: Home / Self Care | Payer: Commercial Managed Care - PPO | Attending: Emergency Medicine | Admitting: Emergency Medicine

## 2014-05-11 ENCOUNTER — Encounter (HOSPITAL_COMMUNITY): Payer: Self-pay | Admitting: Emergency Medicine

## 2014-05-11 DIAGNOSIS — G8911 Acute pain due to trauma: Secondary | ICD-10-CM | POA: Diagnosis not present

## 2014-05-11 DIAGNOSIS — Z79899 Other long term (current) drug therapy: Secondary | ICD-10-CM | POA: Insufficient documentation

## 2014-05-11 DIAGNOSIS — E039 Hypothyroidism, unspecified: Secondary | ICD-10-CM | POA: Diagnosis not present

## 2014-05-11 DIAGNOSIS — M62838 Other muscle spasm: Secondary | ICD-10-CM

## 2014-05-11 DIAGNOSIS — M542 Cervicalgia: Secondary | ICD-10-CM

## 2014-05-11 DIAGNOSIS — Z8742 Personal history of other diseases of the female genital tract: Secondary | ICD-10-CM | POA: Insufficient documentation

## 2014-05-11 DIAGNOSIS — Z91040 Latex allergy status: Secondary | ICD-10-CM | POA: Diagnosis not present

## 2014-05-11 DIAGNOSIS — M25511 Pain in right shoulder: Secondary | ICD-10-CM | POA: Insufficient documentation

## 2014-05-11 DIAGNOSIS — Z791 Long term (current) use of non-steroidal anti-inflammatories (NSAID): Secondary | ICD-10-CM | POA: Diagnosis not present

## 2014-05-11 MED ORDER — DIAZEPAM 5 MG PO TABS: 5.0000 mg | ORAL_TABLET | Freq: Two times a day (BID) | ORAL | Status: DC

## 2014-05-11 MED ORDER — HYDROCODONE-ACETAMINOPHEN 5-325 MG PO TABS: 1.0000 | ORAL_TABLET | Freq: Four times a day (QID) | ORAL | Status: DC | PRN
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
## 2014-05-11 NOTE — ED Notes (Signed)
Bed: WTR5 Expected date:  Expected time:  Means of arrival:  Comments: Pt still in room

## 2014-05-11 NOTE — Discharge Instructions (Signed)
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
## 2014-05-11 NOTE — ED Notes (Signed)
Per pt, states she was in [facility] on Fri-having increased neck and right shoulder pain

## 2014-05-12 ENCOUNTER — Telehealth: Payer: Self-pay | Admitting: *Deleted

## 2014-05-12 NOTE — Telephone Encounter (Signed)
Please call her and have her call [facility] orthopedics and see Dr. who specializes in neck and back pain. Was she seen at the ER? Did she have x-rays.
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
## 2014-05-13 NOTE — Telephone Encounter (Signed)
Pt appointment is scheduled on 05/26/14 @ 1:45pm with Dr.Brooks pt informed. Pt asked if you will see her for "pain management" she went to ER twice and the took her out of work until tomorrow. Pt said she still feels pretty bad. I explained to pt that unsure what you could do for her regarding this but I will check with you for recommendations. Please advise
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
## 2014-05-14 ENCOUNTER — Encounter: Payer: Self-pay | Admitting: *Deleted

## 2014-05-14 MED ORDER — HYDROCODONE-ACETAMINOPHEN 5-325MG PREPACK (~~LOC~~: ORAL_TABLET | ORAL | Status: DC

## 2014-05-14 NOTE — Telephone Encounter (Signed)
Rx will be signed and pt will pick up as well letter will be written.

## 2014-05-26 ENCOUNTER — Telehealth: Payer: Self-pay | Admitting: *Deleted

## 2014-05-26 MED ORDER — TERCONAZOLE 0.8 % VA CREA: 1.0000 | TOPICAL_CREAM | Freq: Every day | VAGINAL | Status: DC

## 2014-05-26 NOTE — Telephone Encounter (Signed)
Pt called c/o yeast infection itching and white discharge only, pt was given Rx for diflucan 150 on OV 05/05/14 but no relief. Pt asked if you could send Rx for cream to pharmacy.

## 2014-05-26 NOTE — Telephone Encounter (Signed)
Yes, Terazol 3 1 applicator at bedtime 3 with refill. Call if no relief. Thanks

## 2014-05-26 NOTE — Telephone Encounter (Signed)
rx sent

## 2014-07-09 ENCOUNTER — Other Ambulatory Visit: Payer: Self-pay | Admitting: Women's Health

## 2014-07-09 NOTE — Telephone Encounter (Signed)
Okay to call in Diflucan  refill, office visit if no relief

## 2014-07-14 ENCOUNTER — Ambulatory Visit (INDEPENDENT_AMBULATORY_CARE_PROVIDER_SITE_OTHER): Payer: Commercial Managed Care - PPO | Admitting: Women's Health

## 2014-07-14 ENCOUNTER — Encounter: Payer: Self-pay | Admitting: Women's Health

## 2014-07-14 VITALS — BP 118/80 | Ht 63.0 in

## 2014-07-14 DIAGNOSIS — B9689 Other specified bacterial agents as the cause of diseases classified elsewhere: Secondary | ICD-10-CM

## 2014-07-14 DIAGNOSIS — R350 Frequency of micturition: Secondary | ICD-10-CM

## 2014-07-14 DIAGNOSIS — B3731 Acute candidiasis of vulva and vagina: Secondary | ICD-10-CM

## 2014-07-14 DIAGNOSIS — N760 Acute vaginitis: Secondary | ICD-10-CM

## 2014-07-14 DIAGNOSIS — A499 Bacterial infection, unspecified: Secondary | ICD-10-CM

## 2014-07-14 DIAGNOSIS — B373 Candidiasis of vulva and vagina: Secondary | ICD-10-CM

## 2014-07-14 LAB — URINALYSIS W MICROSCOPIC + REFLEX CULTURE
Bilirubin Urine: NEGATIVE
Casts: NONE SEEN
Crystals: NONE SEEN
Glucose, UA: NEGATIVE mg/dL
KETONES UR: NEGATIVE mg/dL
Nitrite: NEGATIVE
Protein, ur: NEGATIVE mg/dL
SPECIFIC GRAVITY, URINE: 1.015 (ref 1.005–1.030)
UROBILINOGEN UA: 0.2 mg/dL (ref 0.0–1.0)
pH: 7.5 (ref 5.0–8.0)

## 2014-07-14 LAB — WET PREP FOR TRICH, YEAST, CLUE: TRICH WET PREP: NONE SEEN

## 2014-07-14 MED ORDER — METRONIDAZOLE 500 MG PO TABS: 500.0000 mg | ORAL_TABLET | Freq: Two times a day (BID) | ORAL | Status: DC

## 2014-07-14 MED ORDER — TERCONAZOLE 0.8 % VA CREA: 1.0000 | TOPICAL_CREAM | Freq: Every day | VAGINAL | Status: DC

## 2014-07-14 NOTE — Patient Instructions (Signed)
Monilial Vaginitis Vaginitis in a soreness, swelling and redness (inflammation) of the vagina and vulva. Monilial vaginitis is not a sexually transmitted infection. CAUSES  Yeast vaginitis is caused by yeast (candida) that is normally found in your vagina. With a yeast infection, the candida has overgrown in number to a point that upsets the chemical balance. SYMPTOMS   White, thick vaginal discharge.  Swelling, itching, redness and irritation of the vagina and possibly the lips of the vagina (vulva).  Burning or painful urination.  Painful intercourse. DIAGNOSIS  Things that may contribute to monilial vaginitis are:  Postmenopausal and virginal states.  Pregnancy.  Infections.  Being tired, sick or stressed, especially if you had monilial vaginitis in the past.  Diabetes. Good control will help lower the chance.  Birth control pills.  Tight fitting garments.  Using bubble bath, feminine sprays, douches or deodorant tampons.  Taking certain medications that kill germs (antibiotics).  Sporadic recurrence can occur if you become ill. TREATMENT  Your caregiver will give you medication.  There are several kinds of anti monilial vaginal creams and suppositories specific for monilial vaginitis. For recurrent yeast infections, use a suppository or cream in the vagina 2 times a week, or as directed.  Anti-monilial or steroid cream for the itching or irritation of the vulva may also be used. Get your caregiver's permission.  Painting the vagina with methylene blue solution may help if the monilial cream does not work.  Eating yogurt may help prevent monilial vaginitis. HOME CARE INSTRUCTIONS   Finish all medication as prescribed.  Do not have sex until treatment is completed or after your caregiver tells you it is okay.  Take warm sitz baths.  Do not douche.  Do not use tampons, especially scented ones.  Wear cotton underwear.  Avoid tight pants and panty  hose.  Tell your sexual partner that you have a yeast infection. They should go to their caregiver if they have symptoms such as mild rash or itching.  Your sexual partner should be treated as well if your infection is difficult to eliminate.  Practice safer sex. Use condoms.  Some vaginal medications cause latex condoms to fail. Vaginal medications that harm condoms are:  Cleocin cream.  Butoconazole (Femstat).  Terconazole (Terazol) vaginal suppository.  Miconazole (Monistat) (may be purchased over the counter). SEEK MEDICAL CARE IF:   You have a temperature by mouth above 102 F (38.9 C).  The infection is getting worse after 2 days of treatment.  The infection is not getting better after 3 days of treatment.  You develop blisters in or around your vagina.  You develop vaginal bleeding, and it is not your menstrual period.  You have pain when you urinate.  You develop intestinal problems.  You have pain with sexual intercourse. Document Released: 03/01/2005 Document Revised: 08/14/2011 Document Reviewed: 11/13/2008 [facility] Patient Information 2015 [place], [place]. This information is not intended to replace advice given to you by your health care provider. Make sure you discuss any questions you have with your health care provider. Bacterial Vaginosis Bacterial vaginosis is an infection of the vagina. It happens when too many of certain germs (bacteria) grow in the vagina. HOME CARE  Take your medicine as told by your doctor.  Finish your medicine even if you start to feel better.  Do not have sex until you finish your medicine and are better.  Tell your sex partner that you have an infection. They should see their doctor for treatment.  Practice safe sex.  Use condoms. Have only one sex partner. GET HELP IF:  You are not getting better after 3 days of treatment.  You have more grey fluid (discharge) coming from your vagina than before.  You have more  pain than before.  You have a fever. MAKE SURE YOU:   Understand these instructions.  Will watch your condition.  Will get help right away if you are not doing well or get worse. Document Released: 02/29/2008 Document Revised: 03/12/2013 Document Reviewed: 01/01/2013 [facility] Patient Information 2015 [place], [place]. This information is not intended to replace advice given to you by your health care provider. Make sure you discuss any questions you have with your health care provider.

## 2014-07-14 NOTE — Progress Notes (Signed)
Patient ID: Suzanne Beasley, female   DOB: 07-27-1970, 44 y.o.   MRN: [id number] Presents with complaint of vaginal discharge with itching and irritation, urinary frequency without pain or burning. Reports frequent BV/yeast.  Same partner. Monthly cycle/using no contraception/history of infertility. Declines intervention for infertility.  Exam: Appears well. External genitalia erythematous at introitus, speculum exam moderate amount of watery discharge was noted, wet prep positive for yeast, amines, clues, TNTC bacteria. Bimanual no CMT or adnexal fullness or tenderness, uterus enlarged fibroids.  Recurrent  Yeast vaginitis Recurrent Bacteria vaginosis Asymptomatic Fibroids  Plan: Flagyl 500 twice weekly daily for 7 days #14 alcohol precautions reviewed. Terazol 3 one applicator at bedtime 3, prescription, proper use given and reviewed. Instructed to call if no relief of discharge. Will try boric acid gelcaps twice weekly vaginally after discharge problem resolved, prescription given.

## 2014-07-16 LAB — URINE CULTURE
COLONY COUNT: NO GROWTH
ORGANISM ID, BACTERIA: NO GROWTH

## 2014-07-29 ENCOUNTER — Encounter: Payer: Self-pay | Admitting: Women's Health

## 2014-07-29 ENCOUNTER — Ambulatory Visit (INDEPENDENT_AMBULATORY_CARE_PROVIDER_SITE_OTHER): Payer: Commercial Managed Care - PPO | Admitting: Women's Health

## 2014-07-29 VITALS — BP 134/80 | Ht 63.0 in

## 2014-07-29 DIAGNOSIS — R350 Frequency of micturition: Secondary | ICD-10-CM

## 2014-07-29 DIAGNOSIS — N898 Other specified noninflammatory disorders of vagina: Secondary | ICD-10-CM

## 2014-07-29 LAB — URINALYSIS W MICROSCOPIC + REFLEX CULTURE
Bilirubin Urine: NEGATIVE
Glucose, UA: NEGATIVE mg/dL
Hgb urine dipstick: NEGATIVE
KETONES UR: NEGATIVE mg/dL
LEUKOCYTES UA: NEGATIVE
Nitrite: NEGATIVE
PH: 7 (ref 5.0–8.0)
Protein, ur: NEGATIVE mg/dL
SPECIFIC GRAVITY, URINE: 1.015 (ref 1.005–1.030)
Urobilinogen, UA: 0.2 mg/dL (ref 0.0–1.0)

## 2014-07-29 LAB — WET PREP FOR TRICH, YEAST, CLUE
Clue Cells Wet Prep HPF POC: NONE SEEN
Trich, Wet Prep: NONE SEEN
WBC, Wet Prep HPF POC: NONE SEEN
YEAST WET PREP: NONE SEEN

## 2014-07-29 MED ORDER — FLUCONAZOLE 100 MG PO TABS: ORAL_TABLET | ORAL | Status: DC

## 2014-07-29 NOTE — Progress Notes (Signed)
Patient ID: Suzanne Beasley, female   DOB: [birth year], 44 y.o.   MRN: [id number] Presents with increased urinary frequency without pain or burning, vaginal discharge with vaginal itching and questions if yeast. Treated for BV and yeast 07/14/2014 had good relief of symptoms initially. Has had recurrent BV and yeast. Same partner. Requesting GC/Chlamydia. Was going to start boric acid but wanted to be sure BV and yeast were resolved prior to starting and going on a cruise next week. Denies abdominal pain or fever. Has had chronic back pain. Monthly cycle/no contraception history of infertility.  Exam: Appears well. External genitalia within normal limits, speculum exam minimal discharge, vaginal walls pink healthy no visible erythema or odor noted. Wet prep negative. GC/Chlamydia culture taken. Bimanual no CMT or adnexal tenderness. UA: Negative.  Normal exam with vaginal itching  Plan: Diflucan 200 mg by mouth today repeat in 3 days if needed. Will then start on boric acid gelcaps twice weekly. Instructed to call if no relief of symptoms. GC/Chlamydia culture pending. Condoms encouraged if sexually active.

## 2014-07-30 LAB — GC/CHLAMYDIA PROBE AMP
CT Probe RNA: NEGATIVE
GC Probe RNA: NEGATIVE

## 2014-08-25 ENCOUNTER — Telehealth: Payer: Self-pay | Admitting: *Deleted

## 2014-08-25 NOTE — Telephone Encounter (Signed)
Pt called requesting refill on synthroid 137 mcg, I told pt that per result note on 04/28/14 recheck TSH level in 2 months. Pt will have TSH level checked at her job and fax results to office. # also given for pt to set up mammogram screening.

## 2014-08-26 ENCOUNTER — Other Ambulatory Visit: Payer: Self-pay

## 2014-08-26 DIAGNOSIS — Z139 Encounter for screening, unspecified: Secondary | ICD-10-CM

## 2014-08-27 ENCOUNTER — Other Ambulatory Visit: Payer: Self-pay | Admitting: *Deleted

## 2014-08-27 DIAGNOSIS — R7989 Other specified abnormal findings of blood chemistry: Secondary | ICD-10-CM

## 2014-08-30 ENCOUNTER — Other Ambulatory Visit: Payer: Self-pay | Admitting: Women's Health

## 2014-08-31 ENCOUNTER — Other Ambulatory Visit: Payer: Self-pay

## 2014-08-31 MED ORDER — LEVOTHYROXINE SODIUM 137 MCG PO TABS: 137.0000 ug | ORAL_TABLET | Freq: Every day | ORAL | Status: DC

## 2014-09-04 ENCOUNTER — Other Ambulatory Visit: Payer: Self-pay

## 2014-09-04 ENCOUNTER — Ambulatory Visit
Admission: RE | Admit: 2014-09-04 | Discharge: 2014-09-04 | Disposition: A | Discharge status: Home / Self Care | Payer: Commercial Managed Care - PPO | Source: Ambulatory Visit

## 2014-09-04 DIAGNOSIS — Z1231 Encounter for screening mammogram for malignant neoplasm of breast: Secondary | ICD-10-CM

## 2014-09-09 ENCOUNTER — Encounter: Payer: Self-pay | Admitting: Women's Health

## 2014-09-09 ENCOUNTER — Ambulatory Visit (INDEPENDENT_AMBULATORY_CARE_PROVIDER_SITE_OTHER): Payer: Commercial Managed Care - PPO | Admitting: Women's Health

## 2014-09-09 VITALS — BP 128/80 | Ht 64.0 in

## 2014-09-09 DIAGNOSIS — Z13220 Encounter for screening for lipoid disorders: Secondary | ICD-10-CM | POA: Diagnosis not present

## 2014-09-09 DIAGNOSIS — N920 Excessive and frequent menstruation with regular cycle: Secondary | ICD-10-CM | POA: Diagnosis not present

## 2014-09-09 DIAGNOSIS — Z01419 Encounter for gynecological examination (general) (routine) without abnormal findings: Secondary | ICD-10-CM

## 2014-09-09 DIAGNOSIS — B373 Candidiasis of vulva and vagina: Secondary | ICD-10-CM

## 2014-09-09 DIAGNOSIS — Z113 Encounter for screening for infections with a predominantly sexual mode of transmission: Secondary | ICD-10-CM

## 2014-09-09 DIAGNOSIS — B3731 Acute candidiasis of vulva and vagina: Secondary | ICD-10-CM

## 2014-09-09 LAB — COMPREHENSIVE METABOLIC PANEL
ALK PHOS: 38 U/L — AB (ref 39–117)
ALT: 13 U/L (ref 0–35)
AST: 15 U/L (ref 0–37)
Albumin: 4.1 g/dL (ref 3.5–5.2)
BUN: 12 mg/dL (ref 6–23)
CO2: 20 mEq/L (ref 19–32)
Calcium: 8.9 mg/dL (ref 8.4–10.5)
Chloride: 105 mEq/L (ref 96–112)
Creat: 0.93 mg/dL (ref 0.50–1.10)
Glucose, Bld: 98 mg/dL (ref 70–99)
POTASSIUM: 3.8 meq/L (ref 3.5–5.3)
SODIUM: 137 meq/L (ref 135–145)
TOTAL PROTEIN: 7.3 g/dL (ref 6.0–8.3)
Total Bilirubin: 0.6 mg/dL (ref 0.2–1.2)

## 2014-09-09 LAB — CBC WITH DIFFERENTIAL/PLATELET
BASOS ABS: 0 10*3/uL (ref 0.0–0.1)
BASOS PCT: 0 % (ref 0–1)
EOS ABS: 0 10*3/uL (ref 0.0–0.7)
EOS PCT: 0 % (ref 0–5)
HCT: 41.2 % (ref 36.0–46.0)
Hemoglobin: 14 g/dL (ref 12.0–15.0)
LYMPHS PCT: 11 % — AB (ref 12–46)
Lymphs Abs: 1 10*3/uL (ref 0.7–4.0)
MCH: 30 pg (ref 26.0–34.0)
MCHC: 34 g/dL (ref 30.0–36.0)
MCV: 88.2 fL (ref 78.0–100.0)
MONO ABS: 0.7 10*3/uL (ref 0.1–1.0)
MPV: 10.5 fL (ref 8.6–12.4)
Monocytes Relative: 8 % (ref 3–12)
Neutro Abs: 7.3 10*3/uL (ref 1.7–7.7)
Neutrophils Relative %: 81 % — ABNORMAL HIGH (ref 43–77)
Platelets: 244 10*3/uL (ref 150–400)
RBC: 4.67 MIL/uL (ref 3.87–5.11)
RDW: 13.6 % (ref 11.5–15.5)
WBC: 9 10*3/uL (ref 4.0–10.5)

## 2014-09-09 LAB — LIPID PANEL
Cholesterol: 208 mg/dL — ABNORMAL HIGH (ref 0–200)
HDL: 74 mg/dL (ref >=46)
LDL CALC: 123 mg/dL — AB (ref 0–99)
Total CHOL/HDL Ratio: 2.8 Ratio
Triglycerides: 55 mg/dL (ref <150)
VLDL: 11 mg/dL (ref 0–40)

## 2014-09-09 LAB — WET PREP FOR TRICH, YEAST, CLUE
Clue Cells Wet Prep HPF POC: NONE SEEN
TRICH WET PREP: NONE SEEN

## 2014-09-09 LAB — HEPATITIS C ANTIBODY: HCV AB: NEGATIVE

## 2014-09-09 LAB — HEPATITIS B SURFACE ANTIGEN: Hepatitis B Surface Ag: NEGATIVE

## 2014-09-09 NOTE — Progress Notes (Signed)
Suzanne Beasley [birth year] [id number]    History:    Presents for annual exam. History PCO S and infertility, monthly heavy cycle, using no contraception, pregnancy ok,  blocked rt fallopian tube. Normal mammograms. 04 ascus, normal paps after. 2011 myomectomy. Hypothyroid endocrinologist manages.  Past medical history, past surgical history, family history and social history were all reviewed and documented in the EPIC chart. Works in [place]. Mother uterine cancer stage III successful surgery, chemotherapy and radiation therapy. First husband died of cardiomyopathy. Father diabetes and heart disease.  ROS:  A ROS was performed and pertinent positives and negatives are included.  Exam:  Filed Vitals:   09/09/14 0810  BP: 128/80    General appearance:  Normal Thyroid:  Symmetrical, normal in size, without palpable masses or nodularity. Respiratory  Auscultation:  Clear without wheezing or rhonchi Cardiovascular  Auscultation:  Regular rate, without rubs, murmurs or gallops  Edema/varicosities:  Not grossly evident Abdominal  Soft,nontender, without masses, guarding or rebound.  Liver/spleen:  No organomegaly noted  Hernia:  None appreciated  Skin  Inspection:  Grossly normal   Breasts: Examined lying and sitting.     Right: Without masses, retractions, discharge or axillary adenopathy.     Left: Without masses, retractions, discharge or axillary adenopathy. Gentitourinary   Inguinal/mons:  Normal without inguinal adenopathy  External genitalia:  Normal  BUS/Urethra/Skene's glands:  Normal  Vagina:  Scant white curdy discharge, wet prep positive for yeast  Cervix:  Normal  Uterus:   normal in size, shape and contour.  Midline and mobile  Adnexa/parametria:     Rt: Without masses or tenderness.   Lt: Without masses or tenderness.  Anus and perineum: Normal  Digital rectal exam: Normal sphincter tone without palpated masses or tenderness  Assessment/Plan:  44 y.o. WBF G0  for  annual exam.    Menorrhagia Infertility/PCO S/right fallopian tube blockage 2004 ASCUS normal Paps after 2011 myomectomy Hypothyroidism endocrinologist manages Obesity Yeast vaginitis  Plan: SBE's, annual screening mammogram, calcium rich diet, vitamin D 1000 daily encouraged. Repeat ultrasound, history of fibroids with myomectomy. Encouraged to increase exercise and decrease calories for weight loss. Declines intervention for fertility management. CBC, CMP, lipid panel, UA, HIV, hep B, C, RPR. (GC/Chlamydia -07/2014). Diflucan 200 mg by mouth 1 dose, prescription, proper use given and reviewed, call if no relief of symptoms.    Huel Cote [facility], 8:49 AM 09/09/2014

## 2014-09-09 NOTE — Patient Instructions (Signed)
Health Maintenance Adopting a healthy lifestyle and getting preventive care can go a long way to promote health and wellness. Talk with your health care provider about what schedule of regular examinations is right for you. This is a good chance for you to check in with your provider about disease prevention and staying healthy. In between checkups, there are plenty of things you can do on your own. Experts have done a lot of research about which lifestyle changes and preventive measures are most likely to keep you healthy. Ask your health care provider for more information. WEIGHT AND DIET  Eat a healthy diet  Be sure to include plenty of vegetables, fruits, low-fat dairy products, and lean protein.  Do not eat a lot of foods high in solid fats, added sugars, or salt.  Get regular exercise. This is one of the most important things you can do for your health.  Most adults should exercise for at least 150 minutes each week. The exercise should increase your heart rate and make you sweat (moderate-intensity exercise).  Most adults should also do strengthening exercises at least twice a week. This is in addition to the moderate-intensity exercise.  Maintain a healthy weight  Body mass index (BMI) is a measurement that can be used to identify possible weight problems. It estimates body fat based on height and weight. Your health care provider can help determine your BMI and help you achieve or maintain a healthy weight.  For females 25 years of age and older:   A BMI below 18.5 is considered underweight.  A BMI of 18.5 to 24.9 is normal.  A BMI of 25 to 29.9 is considered overweight.  A BMI of 30 and above is considered obese.  Watch levels of cholesterol and blood lipids  You should start having your blood tested for lipids and cholesterol at 42 years of age, then have this test every 5 years.  You may need to have your cholesterol levels checked more often if:  Your lipid or  cholesterol levels are high.  You are older than 42 years of age.  You are at high risk for heart disease.  CANCER SCREENING   Lung Cancer  Lung cancer screening is recommended for adults 97-92 years old who are at high risk for lung cancer because of a history of smoking.  A yearly low-dose CT scan of the lungs is recommended for people who:  Currently smoke.  Have quit within the past 15 years.  Have at least a 30-pack-year history of smoking. A pack year is smoking an average of one pack of cigarettes a day for 1 year.  Yearly screening should continue until it has been 15 years since you quit.  Yearly screening should stop if you develop a health problem that would prevent you from having lung cancer treatment.  Breast Cancer  Practice breast self-awareness. This means understanding how your breasts normally appear and feel.  It also means doing regular breast self-exams. Let your health care provider know about any changes, no matter how small.  If you are in your 20s or 30s, you should have a clinical breast exam (CBE) by a health care provider every 1-3 years as part of a regular health exam.  If you are 76 or older, have a CBE every year. Also consider having a breast X-ray (mammogram) every year.  If you have a family history of breast cancer, talk to your health care provider about genetic screening.  If you are  at high risk for breast cancer, talk to your health care provider about having an MRI and a mammogram every year.  Breast cancer gene (BRCA) assessment is recommended for women who have family members with BRCA-related cancers. BRCA-related cancers include:  Breast.  Ovarian.  Tubal.  Peritoneal cancers.  Results of the assessment will determine the need for genetic counseling and BRCA1 and BRCA2 testing. Cervical Cancer Routine pelvic examinations to screen for cervical cancer are no longer recommended for nonpregnant women who are considered low  risk for cancer of the pelvic organs (ovaries, uterus, and vagina) and who do not have symptoms. A pelvic examination may be necessary if you have symptoms including those associated with pelvic infections. Ask your health care provider if a screening pelvic exam is right for you.   The Pap test is the screening test for cervical cancer for women who are considered at risk.  If you had a hysterectomy for a problem that was not cancer or a condition that could lead to cancer, then you no longer need Pap tests.  If you are older than 65 years, and you have had normal Pap tests for the past 10 years, you no longer need to have Pap tests.  If you have had past treatment for cervical cancer or a condition that could lead to cancer, you need Pap tests and screening for cancer for at least 20 years after your treatment.  If you no longer get a Pap test, assess your risk factors if they change (such as having a new sexual partner). This can affect whether you should start being screened again.  Some women have medical problems that increase their chance of getting cervical cancer. If this is the case for you, your health care provider may recommend more frequent screening and Pap tests.  The human papillomavirus (HPV) test is another test that may be used for cervical cancer screening. The HPV test looks for the virus that can cause cell changes in the cervix. The cells collected during the Pap test can be tested for HPV.  The HPV test can be used to screen women 30 years of age and older. Getting tested for HPV can extend the interval between normal Pap tests from three to five years.  An HPV test also should be used to screen women of any age who have unclear Pap test results.  After 42 years of age, women should have HPV testing as often as Pap tests.  Colorectal Cancer  This type of cancer can be detected and often prevented.  Routine colorectal cancer screening usually begins at 42 years of  age and continues through 42 years of age.  Your health care provider may recommend screening at an earlier age if you have risk factors for colon cancer.  Your health care provider may also recommend using home test kits to check for hidden blood in the stool.  A small camera at the end of a tube can be used to examine your colon directly (sigmoidoscopy or colonoscopy). This is done to check for the earliest forms of colorectal cancer.  Routine screening usually begins at age 50.  Direct examination of the colon should be repeated every 5-10 years through 42 years of age. However, you may need to be screened more often if early forms of precancerous polyps or small growths are found. Skin Cancer  Check your skin from head to toe regularly.  Tell your health care provider about any new moles or changes in   moles, especially if there is a change in a mole's shape or color.  Also tell your health care provider if you have a mole that is larger than the size of a pencil eraser.  Always use sunscreen. Apply sunscreen liberally and repeatedly throughout the day.  Protect yourself by wearing long sleeves, pants, a wide-brimmed hat, and sunglasses whenever you are outside. HEART DISEASE, DIABETES, AND HIGH BLOOD PRESSURE   Have your blood pressure checked at least every 1-2 years. High blood pressure causes heart disease and increases the risk of stroke.  If you are between 75 years and 42 years old, ask your health care provider if you should take aspirin to prevent strokes.  Have regular diabetes screenings. This involves taking a blood sample to check your fasting blood sugar level.  If you are at a normal weight and have a low risk for diabetes, have this test once every three years after 42 years of age.  If you are overweight and have a high risk for diabetes, consider being tested at a younger age or more often. PREVENTING INFECTION  Hepatitis B  If you have a higher risk for  hepatitis B, you should be screened for this virus. You are considered at high risk for hepatitis B if:  You were born in a country where hepatitis B is common. Ask your health care provider which countries are considered high risk.  Your parents were born in a high-risk country, and you have not been immunized against hepatitis B (hepatitis B vaccine).  You have HIV or AIDS.  You use needles to inject street drugs.  You live with someone who has hepatitis B.  You have had sex with someone who has hepatitis B.  You get hemodialysis treatment.  You take certain medicines for conditions, including cancer, organ transplantation, and autoimmune conditions. Hepatitis C  Blood testing is recommended for:  Everyone born from [birth year] through 1963.  Anyone with known risk factors for hepatitis C. Sexually transmitted infections (STIs)  You should be screened for sexually transmitted infections (STIs) including gonorrhea and chlamydia if:  You are sexually active and are younger than 42 years of age.  You are older than 42 years of age and your health care provider tells you that you are at risk for this type of infection.  Your sexual activity has changed since you were last screened and you are at an increased risk for chlamydia or gonorrhea. Ask your health care provider if you are at risk.  If you do not have HIV, but are at risk, it may be recommended that you take a prescription medicine daily to prevent HIV infection. This is called pre-exposure prophylaxis (PrEP). You are considered at risk if:  You are sexually active and do not regularly use condoms or know the HIV status of your partner(s).  You take drugs by injection.  You are sexually active with a partner who has HIV. Talk with your health care provider about whether you are at high risk of being infected with HIV. If you choose to begin PrEP, you should first be tested for HIV. You should then be tested every 3 months for  as long as you are taking PrEP.  PREGNANCY   If you are premenopausal and you may become pregnant, ask your health care provider about preconception counseling.  If you may become pregnant, take 400 to 800 micrograms (mcg) of folic acid every day.  If you want to prevent pregnancy, talk to your  health care provider about birth control (contraception). OSTEOPOROSIS AND MENOPAUSE   Osteoporosis is a disease in which the bones lose minerals and strength with aging. This can result in serious bone fractures. Your risk for osteoporosis can be identified using a bone density scan.  If you are 65 years of age or older, or if you are at risk for osteoporosis and fractures, ask your health care provider if you should be screened.  Ask your health care provider whether you should take a calcium or vitamin D supplement to lower your risk for osteoporosis.  Menopause may have certain physical symptoms and risks.  Hormone replacement therapy may reduce some of these symptoms and risks. Talk to your health care provider about whether hormone replacement therapy is right for you.  HOME CARE INSTRUCTIONS   Schedule regular health, dental, and eye exams.  Stay current with your immunizations.   Do not use any tobacco products including cigarettes, chewing tobacco, or electronic cigarettes.  If you are pregnant, do not drink alcohol.  If you are breastfeeding, limit how much and how often you drink alcohol.  Limit alcohol intake to no more than 1 drink per day for nonpregnant women. One drink equals 12 ounces of beer, 5 ounces of wine, or 1 ounces of hard liquor.  Do not use street drugs.  Do not share needles.  Ask your health care provider for help if you need support or information about quitting drugs.  Tell your health care provider if you often feel depressed.  Tell your health care provider if you have ever been abused or do not feel safe at home. Document Released: 12/05/2010  Document Revised: 10/06/2013 Document Reviewed: 04/23/2013 [employer] Patient Information 2015 [employer]. This information is not intended to replace advice given to you by your health care provider. Make sure you discuss any questions you have with your health care provider. Exercise to Stay Healthy Exercise helps you become and stay healthy. EXERCISE IDEAS AND TIPS Choose exercises that:  You enjoy.  Fit into your day. You do not need to exercise really hard to be healthy. You can do exercises at a slow or medium level and stay healthy. You can:  Stretch before and after working out.  Try yoga, Pilates, or tai chi.  Lift weights.  Walk fast, swim, jog, run, climb stairs, bicycle, dance, or rollerskate.  Take aerobic classes. Exercises that burn about 150 calories:  Running 1  miles in 15 minutes.  Playing volleyball for 45 to 60 minutes.  Washing and waxing a car for 45 to 60 minutes.  Playing touch football for 45 minutes.  Walking 1  miles in 35 minutes.  Pushing a stroller 1  miles in 30 minutes.  Playing basketball for 30 minutes.  Raking leaves for 30 minutes.  Bicycling 5 miles in 30 minutes.  Walking 2 miles in 30 minutes.  Dancing for 30 minutes.  Shoveling snow for 15 minutes.  Swimming laps for 20 minutes.  Walking up stairs for 15 minutes.  Bicycling 4 miles in 15 minutes.  Gardening for 30 to 45 minutes.  Jumping rope for 15 minutes.  Washing windows or floors for 45 to 60 minutes. Document Released: 06/24/2010 Document Revised: 08/14/2011 Document Reviewed: 06/24/2010 [employer] Patient Information 2015 [employer]. This information is not intended to replace advice given to you by your health care provider. Make sure you discuss any questions you have with your health care provider.  

## 2014-09-10 LAB — URINALYSIS, ROUTINE W REFLEX MICROSCOPIC
Bilirubin Urine: NEGATIVE
GLUCOSE, UA: NEGATIVE mg/dL
HGB URINE DIPSTICK: NEGATIVE
Leukocytes, UA: NEGATIVE
NITRITE: NEGATIVE
Protein, ur: NEGATIVE mg/dL
Specific Gravity, Urine: 1.01 (ref 1.005–1.030)
Urobilinogen, UA: 0.2 mg/dL (ref 0.0–1.0)
pH: 5.5 (ref 5.0–8.0)

## 2014-09-10 LAB — RPR

## 2014-09-10 LAB — HIV ANTIBODY (ROUTINE TESTING W REFLEX): HIV 1&2 Ab, 4th Generation: NONREACTIVE

## 2014-09-21 ENCOUNTER — Encounter: Payer: Self-pay | Admitting: Gynecology

## 2014-09-21 ENCOUNTER — Ambulatory Visit (INDEPENDENT_AMBULATORY_CARE_PROVIDER_SITE_OTHER): Payer: Commercial Managed Care - PPO | Admitting: Gynecology

## 2014-09-21 ENCOUNTER — Telehealth: Payer: Self-pay

## 2014-09-21 VITALS — BP 120/76

## 2014-09-21 DIAGNOSIS — N912 Amenorrhea, unspecified: Secondary | ICD-10-CM | POA: Diagnosis not present

## 2014-09-21 DIAGNOSIS — O200 Threatened abortion: Secondary | ICD-10-CM | POA: Diagnosis not present

## 2014-09-21 LAB — PREGNANCY, URINE: Preg Test, Ur: POSITIVE

## 2014-09-21 NOTE — Patient Instructions (Signed)
Follow up for ultrasound.

## 2014-09-21 NOTE — Telephone Encounter (Signed)
ok 

## 2014-09-21 NOTE — Addendum Note (Signed)
Addended by: Nelva Nay on: 09/21/2014 12:45 PM   Modules accepted: Orders

## 2014-09-21 NOTE — Telephone Encounter (Addendum)
Two positive home UPT's yesterday. Would like an order to have blood PT done her to confirm. Ok?  Also, patient questioned what she should do now?  She is scheduled for u/s on 09/30/14 to assess her fibroids.  Of note, Kristeen Miss me and said patient contacted appt desk this morning stating she had been cramping and wants to be seen. She is coming in this morning to see provider so I am sure these issues will be addressed then.

## 2014-09-21 NOTE — Progress Notes (Signed)
Suzanne Beasley 1971/01/08 [id number]        44 y.o.  G0P0 Presents with LMP 08/04/2014. Positive home pregnancy test. Starting to have upper abdominal bloating and cramping. Does have a history of leiomyoma. No bleeding or significant pain.  Past medical history,surgical history, problem list, medications, allergies, family history and social history were all reviewed and documented in the EPIC chart.  Directed ROS with pertinent positives and negatives documented in the history of present illness/assessment and plan.  Exam: Kim assistant Filed Vitals:   09/21/14 1201  BP: 120/76   General appearance:  Normal Abdomen soft nontender without masses guarding rebound Pelvic external BUS vagina normal. Cervix normal, closed no bleeding. Uterus 6-8 week size soft midline mobile nontender. Adnexa without masses or tenderness.  Assessment/Plan:  44 y.o. G0P0 with above history and exam. Patient actually has ultrasound scheduled next week for follow up of leiomyoma scheduled through Nancy's visit. Recommend moving ultrasound up to this week for viability. Is on a multivitamin and I recommended starting a prenatal vitamin now. General early prenatal counseling given. ASAP call precautions reviewed. Patient understands we do not do obstetrics and she will need to establish care with an obstetrical group in [facility] for ongoing obstetrical care.  Issues of increased maternal medical complications associated advancing maternal age to include diabetes hypertension as well as increased risk of chromosomal abnormalities. Availability of prenatal diagnostic testing reviewed and patient knows that the obstetrical group will discuss this with her    Anastasio Auerbach MD, 12:20 PM 09/21/2014

## 2014-09-23 ENCOUNTER — Encounter: Payer: Self-pay | Admitting: Women's Health

## 2014-09-23 ENCOUNTER — Ambulatory Visit (INDEPENDENT_AMBULATORY_CARE_PROVIDER_SITE_OTHER): Payer: Commercial Managed Care - PPO | Admitting: Women's Health

## 2014-09-23 ENCOUNTER — Other Ambulatory Visit: Payer: Commercial Managed Care - PPO

## 2014-09-23 ENCOUNTER — Ambulatory Visit: Payer: Commercial Managed Care - PPO | Admitting: Women's Health

## 2014-09-23 ENCOUNTER — Other Ambulatory Visit: Payer: Self-pay | Admitting: Women's Health

## 2014-09-23 ENCOUNTER — Ambulatory Visit (INDEPENDENT_AMBULATORY_CARE_PROVIDER_SITE_OTHER): Payer: Commercial Managed Care - PPO

## 2014-09-23 VITALS — BP 118/80 | Ht 64.0 in

## 2014-09-23 DIAGNOSIS — D259 Leiomyoma of uterus, unspecified: Secondary | ICD-10-CM

## 2014-09-23 DIAGNOSIS — N920 Excessive and frequent menstruation with regular cycle: Secondary | ICD-10-CM

## 2014-09-23 DIAGNOSIS — Z36 Encounter for antenatal screening of mother: Secondary | ICD-10-CM | POA: Diagnosis not present

## 2014-09-23 DIAGNOSIS — O3680X0 Pregnancy with inconclusive fetal viability, not applicable or unspecified: Secondary | ICD-10-CM

## 2014-09-23 DIAGNOSIS — N912 Amenorrhea, unspecified: Secondary | ICD-10-CM | POA: Diagnosis not present

## 2014-09-23 DIAGNOSIS — Z3689 Encounter for other specified antenatal screening: Secondary | ICD-10-CM

## 2014-09-23 NOTE — Progress Notes (Signed)
Patient ID: Suzanne Beasley, female   DOB: [birth year], 44 y.o.   MRN: [id number] Presents for viability ultrasound has been having some low abdominal cramping with a home positive UPT. History of long-term infertility. Conceived without intervention. Continues to have some low intermittent pelvic cramping, no discharge, bleeding, nausea or urinary symptoms.   Exam: Appears well. Very happy with pregnancy. LMP 08/08/2014 normal cycle. Ultrasound: Clinical age 55 w 4 d, ultrasound size 6 w 0 d, no fetal heart noted. Transvaginal anteverted uterus gestational sac seen in fundus. Yolk sac seen approximately 2 mm. No fetal pole no fetal heart motion seen. Cervix long and closed. Intramural fibroid 16 x 13 mm, 11 x 18 mm. Right ovary PCOS with normal echo. Left ovary corpus luteum cyst 18 x 16 mm. Minimal fluid in cul-de-sac. Solid echogenic focus on endometrial wall 7 x 6 mm.  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 Reviewed will need repeat ultrasound in several weeks to confirm fetal heart tones. Copy of ultrasound given to take to first prenatal appointment. Safe pregnancy behaviors reviewed. Congratulations given. Tylenol as needed for cramping.

## 2014-09-23 NOTE — Patient Instructions (Signed)
How a Baby Grows During Pregnancy Pregnancy begins when the female's sperm enters the female's egg. This happens in the fallopian tube and is called fertilization. The fertilized egg is called an embryo until it reaches 9 weeks from the time of fertilization. From 9 weeks until birth it is called a fetus. The fertilized egg moves down the tube into the uterus and attaches to the inside lining of the uterus.  The pregnant woman is responsible for the growth of the embryo/fetus by supplying nourishment and oxygen through the blood stream and placenta to the developing fetus. The uterus becomes larger and pops out from the abdomen more and more as the fetus develops and grows. A normal pregnancy lasts 280 days, with a range of 259 to 294 days, or 40 weeks. The pregnancy is divided up into three trimesters:  First trimester - 0 to 13 weeks.  Second trimester - 14 to 27 weeks.  Third trimester - 28 to 40 weeks. The day your baby is supposed to be born is called estimated date of confinement [facility]) or estimated date of delivery (EDD). GROWTH OF THE BABY MONTH BY MONTH 1. First Month: The fertilized egg attaches to the inside of the uterus and certain cells will form the placenta and others will develop into the fetus. The arms, legs, brain, spinal cord, lungs, and heart begin to develop. At the end of the first month the heart begins to beat. The embryo weighs less than an ounce and is  inch long. 2. Second Month: The bones can be seen, the inner ear, eye lids, hands and feet form and genitals develop. By the end of 8 weeks, all of the major organs are developing. The fetus now weighs less than an ounce and is one inch (2.54 cm) long. 3. Third Month: Teeth buds appear, all the internal organs are forming, bones and muscles begin to grow, the spine can flex and the skin is transparent. Finger and toe nails begin to form, the hands develop faster than the feet and the arms are longer than the legs at this point.  The fetus weighs a little more than an ounce (0.03 kg) and is 3 inches (8.89cm) long. 4. Fourth Month: The placenta is completely formed. The external sex organs, neck, outer ear, eyebrows, eyelids and fingernails are formed. The fetus can hear, swallow, flex its arms and legs and the kidney begins to produce urine. The skin is covered with a white waxy coating (vernix) and very thin hair (lanugo) is present. The fetus weighs 5 ounces (0.14kg) and is 6 to 7 inches (16.51cm) long. 5. Fifth Month: The fetus moves around more and can be felt for the first time (called quickening), sleeps and wakes up at times, may begin to suck its finger and the nails grow to the end of the fingers. The gallbladder is now functioning and helps to digest the nutrients, eggs are formed in the female and the testicles begin to drop down from the abdomen to the scrotum in the female. The fetus weighs  to 1 pound (0.45kg) and is 10 inches (25.4cm) long. 6. Sixth Month: The lungs are formed but the fetus does not breath yet. The eyes open, the brain develops more quickly at this time, one can detect finger and toe prints and thicker hair grows. The fetus weighs 1 to 1 pounds (0.68kg) and is 12 inches (30.48cm) long. 7. Seventh Month: The fetus can hear and respond to sounds, kicks and stretches and can sense  changes in light. The fetus weighs 2 to 2 pounds (1.13kg) and is 14 inches (35.56cm) long. 8. Eight Month: All organs and body systems are fully developed and functioning. The bones get harder, taste buds develop and can taste sweet and sour flavors and the fetus may hiccup now. Different parts of the brain are developing and the skull remains soft for the brain to grow. The fetus weighs 5 pounds (2.27kg) and is 18 inches (45.75cm) long. 9. Ninth Month: The fetus gains about a half a pound a week, the lungs are fully developed, patterns of sleep develop and the head moves down into the bottom of the uterus called vertex. If the  buttocks moves into the bottom of the uterus, it is called a breech. The fetus weighs 6 to 9 pounds (2.72 to 4.08kg) and is 20 inches (50.8cm) long. You should be informed about your pregnancy, yourself and how the baby is developing as much as possible. Being informed helps you to enjoy this experience. It also gives you the sense to feel if something is not going right and when to ask questions. Talk to your caregiver when you have questions about your baby or your own body. Document Released: 11/08/2007 Document Revised: 08/14/2011 Document Reviewed: 11/08/2007 [facility] Patient Information 2015 [place], [place]. This information is not intended to replace advice given to you by your health care provider. Make sure you discuss any questions you have with your health care provider.

## 2014-09-24 ENCOUNTER — Other Ambulatory Visit: Payer: Self-pay | Admitting: Women's Health

## 2014-09-24 DIAGNOSIS — O3680X0 Pregnancy with inconclusive fetal viability, not applicable or unspecified: Secondary | ICD-10-CM

## 2014-09-26 ENCOUNTER — Telehealth: Payer: Self-pay | Admitting: Gynecology

## 2014-09-26 NOTE — Telephone Encounter (Signed)
On Call Note [phone] AM :  Awoke spotting, now gone.  No pain/cramping.  Ultrasound 09/23/2014 "gestational sac seen in fundus. Yolk sac seen approximately 2 mm. No fetal pole no fetal heart motion seen".  Has follow up ultrasound scheduled next week.  Recommend low level activities this weekend.  If bleeding resolves then follow up at ultrasound appointment.  If bleeding increases or pain call/follow up sooner.

## 2014-09-29 ENCOUNTER — Telehealth: Payer: Self-pay | Admitting: *Deleted

## 2014-09-29 NOTE — Telephone Encounter (Signed)
Pt asked if you could call her at [phone] to speak about her appointment with OB MD today. Please advise

## 2014-09-29 NOTE — Telephone Encounter (Signed)
Telephone call, states Dr. was concerned about growth of baby, heartbeat noted today, heartbeat not noted on ultrasound last week. Had some pink spotting 2 days ago, none now. Will keep scheduled follow-up at new OB.

## 2014-09-30 ENCOUNTER — Ambulatory Visit: Payer: Commercial Managed Care - PPO | Admitting: Women's Health

## 2014-09-30 ENCOUNTER — Other Ambulatory Visit: Payer: Commercial Managed Care - PPO

## 2014-10-02 ENCOUNTER — Ambulatory Visit: Payer: Commercial Managed Care - PPO | Admitting: Women's Health

## 2014-10-02 ENCOUNTER — Other Ambulatory Visit: Payer: Commercial Managed Care - PPO

## 2014-10-07 ENCOUNTER — Encounter (HOSPITAL_COMMUNITY): Payer: Self-pay | Admitting: *Deleted

## 2014-10-07 ENCOUNTER — Telehealth: Payer: Self-pay | Admitting: *Deleted

## 2014-10-07 MED ORDER — DOXYCYCLINE HYCLATE 100 MG IV SOLR
100.0000 mg | Freq: Once | INTRAVENOUS | Status: AC
  Administered 2014-10-08: 100 mg via INTRAVENOUS
  Filled 2014-10-07: qty 100

## 2014-10-07 NOTE — Telephone Encounter (Signed)
Pt called as FYI to let you that she is scheduled for a D&C tomorrow.

## 2014-10-07 NOTE — Telephone Encounter (Signed)
Condolences note sent

## 2014-10-07 NOTE — H&P (Signed)
Suzanne Beasley is an 44 y.o. female with missed abortion at 65 weeks.  She was counseled regarding options: expectant, medical and surgical management.  Patient elected to proceed with D&C.  Pertinent Gynecological History: Menses: pregnant Bleeding: pregnant Contraception: pregnant DES exposure: unknown Blood transfusions: none Sexually transmitted diseases: no past history Previous GYN Procedures: none  Last mammogram: n/a Date: n/a Last pap: n/a Date: n/a    Menstrual History: Menarche age: n/a Patient's last menstrual period was 08/08/2014.    Past Medical History  Diagnosis Date  . Hypothyroidism   . Hyperlipidemia     diet contolled - no meds  . Heartburn     Past Surgical History  Procedure Laterality Date  . Myomectomy  2011    abd    Family History  Problem Relation Age of Onset  . Hypertension Mother   . Diabetes Father   . Hypertension Father   . Heart disease Father   . Cancer Father     prostate    Social History:  reports that she has never smoked. She has never used smokeless tobacco. She reports that she drinks alcohol. She reports that she does not use illicit drugs.  Allergies:  Allergies  Allergen Reactions  . Shellfish Allergy Nausea Only  . Latex Itching    No prescriptions prior to admission    ROS  Height 5' 4"  (1.626 m), weight 208 lb (94.348 kg), last menstrual period 08/08/2014. Physical Exam  Constitutional: She is oriented to person, place, and time. She appears well-developed and well-nourished.  GI: Soft. There is no rebound and no guarding.  Neurological: She is alert and oriented to person, place, and time.  Skin: Skin is warm and dry.  Psychiatric: She has a normal mood and affect. Her behavior is normal.    No results found for this or any previous visit (from the past 24 hour(s)).  No results found.  Assessment/Plan: Missed ab at 6 weeks -Patient is counseled re: risk of bleeding, infection, scarring and  damage to surrounding structures.  All questions were answered. -Suction D&C  Suzanne Beasley 10/07/2014, 10:20 AM

## 2014-10-08 ENCOUNTER — Ambulatory Visit (HOSPITAL_COMMUNITY): Payer: Commercial Managed Care - PPO | Admitting: Anesthesiology

## 2014-10-08 ENCOUNTER — Ambulatory Visit (HOSPITAL_COMMUNITY)
Admission: RE | Admit: 2014-10-08 | Discharge: 2014-10-08 | Disposition: A | Discharge status: Home / Self Care | Payer: Commercial Managed Care - PPO | Source: Ambulatory Visit | Attending: Obstetrics & Gynecology | Admitting: Obstetrics & Gynecology

## 2014-10-08 ENCOUNTER — Encounter (HOSPITAL_COMMUNITY)
Admission: RE | Disposition: A | Discharge status: Home / Self Care | Payer: Self-pay | Source: Ambulatory Visit | Attending: Obstetrics & Gynecology

## 2014-10-08 ENCOUNTER — Encounter (HOSPITAL_COMMUNITY): Payer: Self-pay | Admitting: Anesthesiology

## 2014-10-08 DIAGNOSIS — E669 Obesity, unspecified: Secondary | ICD-10-CM | POA: Insufficient documentation

## 2014-10-08 DIAGNOSIS — Z3A01 Less than 8 weeks gestation of pregnancy: Secondary | ICD-10-CM | POA: Insufficient documentation

## 2014-10-08 DIAGNOSIS — E785 Hyperlipidemia, unspecified: Secondary | ICD-10-CM | POA: Insufficient documentation

## 2014-10-08 DIAGNOSIS — Z91040 Latex allergy status: Secondary | ICD-10-CM | POA: Diagnosis not present

## 2014-10-08 DIAGNOSIS — Z6835 Body mass index (BMI) 35.0-35.9, adult: Secondary | ICD-10-CM | POA: Insufficient documentation

## 2014-10-08 DIAGNOSIS — E039 Hypothyroidism, unspecified: Secondary | ICD-10-CM | POA: Insufficient documentation

## 2014-10-08 DIAGNOSIS — O021 Missed abortion: Secondary | ICD-10-CM | POA: Insufficient documentation

## 2014-10-08 DIAGNOSIS — Z91013 Allergy to seafood: Secondary | ICD-10-CM | POA: Diagnosis not present

## 2014-10-08 HISTORY — DX: Hypothyroidism, unspecified: E03.9

## 2014-10-08 HISTORY — PX: DILATION AND EVACUATION: SHX1459

## 2014-10-08 HISTORY — DX: Hyperlipidemia, unspecified: E78.5

## 2014-10-08 HISTORY — DX: Heartburn: R12

## 2014-10-08 LAB — TYPE AND SCREEN
ABO/RH(D): O POS
ANTIBODY SCREEN: NEGATIVE

## 2014-10-08 LAB — CBC
HCT: 35.5 % — ABNORMAL LOW (ref 36.0–46.0)
Hemoglobin: 12.5 g/dL (ref 12.0–15.0)
MCH: 30 pg (ref 26.0–34.0)
MCHC: 35.2 g/dL (ref 30.0–36.0)
MCV: 85.3 fL (ref 78.0–100.0)
Platelets: 228 10*3/uL (ref 150–400)
RBC: 4.16 MIL/uL (ref 3.87–5.11)
RDW: 12.8 % (ref 11.5–15.5)
WBC: 5.2 10*3/uL (ref 4.0–10.5)

## 2014-10-08 LAB — ABO/RH: ABO/RH(D): O POS

## 2014-10-08 SURGERY — DILATION AND EVACUATION, UTERUS
Anesthesia: Monitor Anesthesia Care | Site: Vagina

## 2014-10-08 MED ORDER — MIDAZOLAM HCL 2 MG/2ML IJ SOLN
INTRAMUSCULAR | Status: DC | PRN
  Administered 2014-10-08: 2 mg via INTRAVENOUS

## 2014-10-08 MED ORDER — CHLOROPROCAINE HCL 1 % IJ SOLN
INTRAMUSCULAR | Status: AC
  Filled 2014-10-08: qty 30

## 2014-10-08 MED ORDER — FENTANYL CITRATE (PF) 250 MCG/5ML IJ SOLN
INTRAMUSCULAR | Status: AC
  Filled 2014-10-08: qty 5

## 2014-10-08 MED ORDER — LIDOCAINE HCL (CARDIAC) 20 MG/ML IV SOLN
INTRAVENOUS | Status: DC | PRN
  Administered 2014-10-08: 50 mg via INTRAVENOUS

## 2014-10-08 MED ORDER — 0.9 % SODIUM CHLORIDE (POUR BTL) OPTIME
TOPICAL | Status: DC | PRN
  Administered 2014-10-08: 1000 mL

## 2014-10-08 MED ORDER — FENTANYL CITRATE (PF) 100 MCG/2ML IJ SOLN: 25.0000 ug | INTRAMUSCULAR | Status: DC | PRN

## 2014-10-08 MED ORDER — KETOROLAC TROMETHAMINE 30 MG/ML IJ SOLN: 30.0000 mg | Freq: Once | INTRAMUSCULAR | Status: DC | PRN

## 2014-10-08 MED ORDER — MIDAZOLAM HCL 2 MG/2ML IJ SOLN
INTRAMUSCULAR | Status: AC
  Filled 2014-10-08: qty 2

## 2014-10-08 MED ORDER — KETOROLAC TROMETHAMINE 30 MG/ML IJ SOLN
INTRAMUSCULAR | Status: AC
  Filled 2014-10-08: qty 1

## 2014-10-08 MED ORDER — DOXYCYCLINE HYCLATE 100 MG PO TABS
200.0000 mg | ORAL_TABLET | Freq: Once | ORAL | Status: AC
  Administered 2014-10-08: 200 mg via ORAL

## 2014-10-08 MED ORDER — LIDOCAINE HCL (CARDIAC) 20 MG/ML IV SOLN
INTRAVENOUS | Status: AC
  Filled 2014-10-08: qty 5

## 2014-10-08 MED ORDER — PANTOPRAZOLE SODIUM 40 MG PO TBEC
40.0000 mg | DELAYED_RELEASE_TABLET | Freq: Every day | ORAL | Status: DC
  Administered 2014-10-08: 40 mg via ORAL

## 2014-10-08 MED ORDER — PANTOPRAZOLE SODIUM 40 MG PO TBEC
DELAYED_RELEASE_TABLET | ORAL | Status: AC
  Filled 2014-10-08: qty 1

## 2014-10-08 MED ORDER — OXYCODONE-ACETAMINOPHEN 5-325 MG PO TABS: 1.0000 | ORAL_TABLET | ORAL | Status: DC | PRN

## 2014-10-08 MED ORDER — DEXAMETHASONE SODIUM PHOSPHATE 4 MG/ML IJ SOLN
INTRAMUSCULAR | Status: AC
  Filled 2014-10-08: qty 1

## 2014-10-08 MED ORDER — PROPOFOL 10 MG/ML IV BOLUS
INTRAVENOUS | Status: DC | PRN
Start: 2014-10-08 — End: 2014-10-08
  Administered 2014-10-08: 10 mg via INTRAVENOUS
  Administered 2014-10-08: 20 mg via INTRAVENOUS
  Administered 2014-10-08: 10 mg via INTRAVENOUS
  Administered 2014-10-08 (×7): 20 mg via INTRAVENOUS

## 2014-10-08 MED ORDER — DEXAMETHASONE SODIUM PHOSPHATE 10 MG/ML IJ SOLN
INTRAMUSCULAR | Status: DC | PRN
  Administered 2014-10-08: 4 mg via INTRAVENOUS

## 2014-10-08 MED ORDER — DOXYCYCLINE HYCLATE 100 MG PO TABS
ORAL_TABLET | ORAL | Status: DC
Start: 2014-10-08 — End: 2014-10-08
  Filled 2014-10-08: qty 2

## 2014-10-08 MED ORDER — OXYCODONE HCL 5 MG/5ML PO SOLN: 5.0000 mg | Freq: Once | ORAL | Status: DC | PRN

## 2014-10-08 MED ORDER — ONDANSETRON HCL 4 MG/2ML IJ SOLN
INTRAMUSCULAR | Status: AC
  Filled 2014-10-08: qty 2

## 2014-10-08 MED ORDER — IBUPROFEN 800 MG PO TABS: 800.0000 mg | ORAL_TABLET | Freq: Four times a day (QID) | ORAL | Status: DC | PRN

## 2014-10-08 MED ORDER — MEPERIDINE HCL 25 MG/ML IJ SOLN: 6.2500 mg | INTRAMUSCULAR | Status: DC | PRN

## 2014-10-08 MED ORDER — PROPOFOL 10 MG/ML IV BOLUS
INTRAVENOUS | Status: AC
  Filled 2014-10-08: qty 20

## 2014-10-08 MED ORDER — ONDANSETRON HCL 4 MG/2ML IJ SOLN: 4.0000 mg | Freq: Once | INTRAMUSCULAR | Status: DC | PRN

## 2014-10-08 MED ORDER — PANTOPRAZOLE SODIUM 40 MG PO TBEC
DELAYED_RELEASE_TABLET | ORAL | Status: AC
  Administered 2014-10-08: 40 mg via ORAL
  Filled 2014-10-08: qty 1

## 2014-10-08 MED ORDER — LACTATED RINGERS IV SOLN
INTRAVENOUS | Status: DC
  Administered 2014-10-08: 12:00:00 via INTRAVENOUS

## 2014-10-08 MED ORDER — FENTANYL CITRATE (PF) 100 MCG/2ML IJ SOLN
INTRAMUSCULAR | Status: DC | PRN
  Administered 2014-10-08: 100 ug via INTRAVENOUS
  Administered 2014-10-08: 50 ug via INTRAVENOUS

## 2014-10-08 MED ORDER — KETOROLAC TROMETHAMINE 30 MG/ML IJ SOLN
INTRAMUSCULAR | Status: DC | PRN
  Administered 2014-10-08: 30 mg via INTRAVENOUS

## 2014-10-08 MED ORDER — DEXTROSE IN LACTATED RINGERS 5 % IV SOLN: INTRAVENOUS | Status: DC

## 2014-10-08 MED ORDER — ONDANSETRON HCL 4 MG/2ML IJ SOLN
INTRAMUSCULAR | Status: DC | PRN
  Administered 2014-10-08: 4 mg via INTRAVENOUS

## 2014-10-08 MED ORDER — OXYCODONE HCL 5 MG PO TABS: 5.0000 mg | ORAL_TABLET | Freq: Once | ORAL | Status: DC | PRN

## 2014-10-08 MED ORDER — CHLOROPROCAINE HCL 1 % IJ SOLN
INTRAMUSCULAR | Status: DC | PRN
  Administered 2014-10-08: 10 mL

## 2014-10-08 SURGICAL SUPPLY — 15 items
CATH ROBINSON RED A/P 16FR (CATHETERS) ×3 IMPLANT
CLOTH BEACON ORANGE TIMEOUT ST (SAFETY) ×3 IMPLANT
DECANTER SPIKE VIAL GLASS SM (MISCELLANEOUS) ×3 IMPLANT
GLOVE BIO SURGEON STRL SZ 6 (GLOVE) ×3 IMPLANT
GLOVE BIOGEL PI IND STRL 6 (GLOVE) ×2 IMPLANT
GLOVE BIOGEL PI INDICATOR 6 (GLOVE) ×4
GOWN STRL REUS W/TWL LRG LVL3 (GOWN DISPOSABLE) ×6 IMPLANT
KIT BERKELEY 1ST TRIMESTER 3/8 (MISCELLANEOUS) ×3 IMPLANT
NS IRRIG 1000ML POUR BTL (IV SOLUTION) ×3 IMPLANT
PACK VAGINAL MINOR WOMEN LF (CUSTOM PROCEDURE TRAY) ×3 IMPLANT
PAD OB MATERNITY 4.3X12.25 (PERSONAL CARE ITEMS) ×3 IMPLANT
PAD PREP 24X48 CUFFED NSTRL (MISCELLANEOUS) ×3 IMPLANT
SET BERKELEY SUCTION TUBING (SUCTIONS) ×3 IMPLANT
TOWEL OR 17X24 6PK STRL BLUE (TOWEL DISPOSABLE) ×6 IMPLANT
VACURETTE 7MM CVD STRL WRAP (CANNULA) ×2 IMPLANT

## 2014-10-08 NOTE — Op Note (Signed)
Patient: Suzanne Beasley, Suzanne Beasley DOB: Jul 31, 1970 PREOPERATIVE DIAGNOSIS: Missed abortion at 6 weeks  POSTOPERATIVE DIAGNOSIS: The same  PROCEDURE: Suction Dilation and Curettage  SURGEON: Dr. Linda Hedges  INDICATIONS: 44 y.o. G1P0 here for scheduled surgery for suction D&C for missed abortion at 6 weeks. Risks of surgery were discussed with the patient including but not limited to: bleeding which may require transfusion; infection which may require antibiotics; injury to uterus or surrounding organs; intrauterine scarring which may impair future fertility; need for additional procedures including laparotomy or laparoscopy; and other postoperative/anesthesia complications. Written informed consent was obtained.  FINDINGS: A 6 week size uterus.  ANESTHESIA: conscious sedation  ESTIMATED BLOOD LOSS: 10cc SPECIMENS: Products of conception  COMPLICATIONS: None immediate.  PROCEDURE DETAILS: The patient received intravenous antibiotics while in the preoperative area. She was then taken to the operating room where conscious sedation was administered. After an adequate timeout was performed, she was placed in the dorsal lithotomy position and examined; then prepped and draped in the sterile manner. Her bladder was catheterized for an unmeasured amount of clear, yellow urine. A speculum was then placed in the patient's vagina. A single tooth tenaculum was applied to the anterior lip of the cervix. Paracervical block was performed using 10cc 1% nesacaine. The uteus was dilated manually with Pratt cervical dilators up to 21 [place]. Once the cervix was dilated, 7 mm suction curette was advanced and suction attached. Three suction curettage passes were performed which did remove tissue. A sharp curettage was then performed to obtain a scant amount of endometrial curettings and revealed a gritty texture circumferentially. The tenaculum was removed from the anterior lip of the cervix and the vaginal speculum was  removed after noting good hemostasis. The patient tolerated the procedure well and was taken to the recovery area awake, extubated and in stable condition.

## 2014-10-08 NOTE — Progress Notes (Signed)
No change to H&P.

## 2014-10-08 NOTE — Discharge Instructions (Signed)
Call MD for T>100.4, heavy vaginal bleeding, severe abdominal pain, intractable nausea and/or vomiting, or respiratory distress.  Call office to schedule postop appointment in 2 weeks.  No driving while taking narcotics.  Pelvic rest x 4 weeks.DISCHARGE INSTRUCTIONS: D&C / D&E The following instructions have been prepared to help you care for yourself upon your return home.   Personal hygiene:  Use sanitary pads for vaginal drainage, not tampons.  Shower the day after your procedure.  NO tub baths, pools or Jacuzzis for 2-3 weeks.  Wipe front to back after using the bathroom.  Activity and limitations:  Do NOT drive or operate any equipment for 24 hours. The effects of anesthesia are still present and drowsiness may result.  Do NOT rest in bed all day.  Walking is encouraged.  Walk up and down stairs slowly.  You may resume your normal activity in one to two days or as indicated by your physician.  Sexual activity: NO intercourse for at least 2 weeks after the procedure, or as indicated by your physician.  Diet: Eat a light meal as desired this evening. You may resume your usual diet tomorrow.  Return to work: You may resume your work activities in one to two days or as indicated by your doctor.  What to expect after your surgery: Expect to have vaginal bleeding/discharge for 2-3 days and spotting for up to 10 days. It is not unusual to have soreness for up to 1-2 weeks. You may have a slight burning sensation when you urinate for the first day. Mild cramps may continue for a couple of days. You may have a regular period in 2-6 weeks.  Call your doctor for any of the following:  Excessive vaginal bleeding, saturating and changing one pad every hour.  Inability to urinate 6 hours after discharge from hospital.  Pain not relieved by pain medication.  Fever of 100.4 F or greater.  Unusual vaginal discharge or odor.   Call for an appointment:    Patients signature:  ______________________  Nurses signature ________________________  Support person's signature_______________________   Post Anesthesia Home Care Instructions  Activity: Get plenty of rest for the remainder of the day. A responsible adult should stay with you for 24 hours following the procedure.  For the next 24 hours, DO NOT: -Drive a car -Paediatric nurse -Drink alcoholic beverages -Take any medication unless instructed by your physician -Make any legal decisions or sign important papers.  Meals: Start with liquid foods such as gelatin or soup. Progress to regular foods as tolerated. Avoid greasy, spicy, heavy foods. If nausea and/or vomiting occur, drink only clear liquids until the nausea and/or vomiting subsides. Call your physician if vomiting continues.  Special Instructions/Symptoms: Your throat may feel dry or sore from the anesthesia or the breathing tube placed in your throat during surgery. If this causes discomfort, gargle with warm salt water. The discomfort should disappear within 24 hours.  If you had a scopolamine patch placed behind your ear for the management of post- operative nausea and/or vomiting:  1. The medication in the patch is effective for 72 hours, after which it should be removed.  Wrap patch in a tissue and discard in the trash. Wash hands thoroughly with soap and water. 2. You may remove the patch earlier than 72 hours if you experience unpleasant side effects which may include dry mouth, dizziness or visual disturbances. 3. Avoid touching the patch. Wash your hands with soap and water after contact with the patch.

## 2014-10-08 NOTE — Anesthesia Postprocedure Evaluation (Signed)
  Anesthesia Post-op Note  Patient: Suzanne Beasley  Procedure(s) Performed: Procedure(s): DILATATION AND EVACUATION (N/A)  Patient Location:    Anesthesia Type:General  Level of Consciousness: awake and alert   Airway and Oxygen Therapy: Patient Spontanous Breathing and Patient connected to nasal cannula oxygen  Post-op Pain: mild  Post-op Assessment: Post-op Vital signs reviewed and Patient's Cardiovascular Status Stable  Post-op Vital Signs: Reviewed and stable  Last Vitals:  Filed Vitals:   10/08/14 1415  BP: 110/68  Pulse: 64  Temp:   Resp: 18    Complications: No apparent anesthesia complications

## 2014-10-08 NOTE — Anesthesia Preprocedure Evaluation (Addendum)
Anesthesia Evaluation  Patient identified by MRN, date of birth, ID band Patient awake    Reviewed: Allergy & Precautions, H&P , NPO status , Patient's Chart, lab work & pertinent test results  Airway Mallampati: I  TM Distance: >3 FB Neck ROM: full    Dental no notable dental hx.    Pulmonary neg pulmonary ROS,    Pulmonary exam normal       Cardiovascular negative cardio ROS Normal cardiovascular exam    Neuro/Psych negative neurological ROS  negative psych ROS   GI/Hepatic negative GI ROS, Neg liver ROS,   Endo/Other  Hypothyroidism   Renal/GU negative Renal ROS     Musculoskeletal   Abdominal (+) + obese,   Peds  Hematology negative hematology ROS (+)   Anesthesia Other Findings   Reproductive/Obstetrics                            Anesthesia Physical Anesthesia Plan  ASA: II  Anesthesia Plan: MAC   Post-op Pain Management:    Induction: Intravenous  Airway Management Planned:   Additional Equipment:   Intra-op Plan:   Post-operative Plan:   Informed Consent: I have reviewed the patients History and Physical, chart, labs and discussed the procedure including the risks, benefits and alternatives for the proposed anesthesia with the patient or authorized representative who has indicated his/her understanding and acceptance.     Plan Discussed with: CRNA and Surgeon  Anesthesia Plan Comments:         Anesthesia Quick Evaluation

## 2014-10-08 NOTE — Transfer of Care (Signed)
Immediate Anesthesia Transfer of Care Note  Patient: Suzanne Beasley  Procedure(s) Performed: Procedure(s): DILATATION AND EVACUATION (N/A)  Patient Location: PACU  Anesthesia Type:MAC  Level of Consciousness: sedated  Airway & Oxygen Therapy: Patient Spontanous Breathing and Patient connected to nasal cannula oxygen  Post-op Assessment: Report given to RN  Post vital signs: Reviewed and stable  Last Vitals:  Filed Vitals:   10/08/14 1200  BP: 140/76  Pulse: 82  Temp: 36.4 C  Resp: 20    Complications: No apparent anesthesia complications

## 2014-10-10 ENCOUNTER — Encounter (HOSPITAL_COMMUNITY): Payer: Self-pay | Admitting: Obstetrics & Gynecology

## 2014-10-27 ENCOUNTER — Other Ambulatory Visit: Payer: Self-pay | Admitting: Women's Health

## 2014-11-26 ENCOUNTER — Telehealth: Payer: Self-pay | Admitting: *Deleted

## 2014-11-26 MED ORDER — METRONIDAZOLE 500 MG PO TABS: 500.0000 mg | ORAL_TABLET | Freq: Two times a day (BID) | ORAL | Status: DC

## 2014-11-26 NOTE — Telephone Encounter (Signed)
Okay for Flagyl 500 twice daily for 7 days office visit if no relief. History of recurrent BV.

## 2014-11-26 NOTE — Telephone Encounter (Signed)
Pt aware Rx sent.  

## 2014-11-26 NOTE — Telephone Encounter (Signed)
Patient called requesting a pill for BV infection states she has refill for metrogel, but would like pill Rx. Do you want pt to make OV? Please advise

## 2014-12-31 ENCOUNTER — Other Ambulatory Visit: Payer: Self-pay | Admitting: Women's Health

## 2015-03-23 ENCOUNTER — Telehealth: Payer: Self-pay | Admitting: *Deleted

## 2015-03-23 NOTE — Telephone Encounter (Signed)
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 Pt said she has increased weight gain. Please advise

## 2015-03-23 NOTE — Telephone Encounter (Signed)
Yes  check a TSH and fax results here.

## 2015-03-24 MED ORDER — LEVOTHYROXINE SODIUM 137 MCG PO TABS: ORAL_TABLET | ORAL | Status: DC

## 2015-03-24 NOTE — Telephone Encounter (Signed)
Left on pt voicemail order will be faxed, needs refill on synthroid 137 mcg, rx sent.

## 2015-03-30 ENCOUNTER — Telehealth: Payer: Self-pay | Admitting: *Deleted

## 2015-03-30 DIAGNOSIS — R7989 Other specified abnormal findings of blood chemistry: Secondary | ICD-10-CM

## 2015-03-30 MED ORDER — LEVOTHYROXINE SODIUM 125 MCG PO TABS: 125.0000 ug | ORAL_TABLET | Freq: Every day | ORAL | Status: DC

## 2015-03-30 NOTE — Telephone Encounter (Signed)
Pt had TSH level checked at her job at [employer], results on your desk, currently taking synthroid 137 mcg. Please advise

## 2015-03-30 NOTE — Telephone Encounter (Signed)
Pt aware, Rx sent, pt said she has not lost any more weight has gained weight, lost mother this past July was your patient as well.

## 2015-03-30 NOTE — Telephone Encounter (Signed)
Will need to decrease to 125 g of Synthroid daily. Recheck TSH in 6 weeks. Please call and Rx for patient. She has lost some weight, ask if she is still losing weight.

## 2015-05-28 ENCOUNTER — Other Ambulatory Visit: Payer: Self-pay | Admitting: Women's Health

## 2015-06-01 ENCOUNTER — Telehealth: Payer: Self-pay | Admitting: *Deleted

## 2015-06-01 NOTE — Telephone Encounter (Signed)
Pt called needs TSH level recheck for refill synthroid, pt asked if her lab at her job could drawn and fax results to our office. Order faxed to [phone]

## 2015-06-04 ENCOUNTER — Other Ambulatory Visit: Payer: Self-pay | Admitting: Women's Health

## 2015-06-04 DIAGNOSIS — E038 Other specified hypothyroidism: Secondary | ICD-10-CM

## 2015-06-04 MED ORDER — LEVOTHYROXINE SODIUM 112 MCG PO TABS: 112.0000 ug | ORAL_TABLET | Freq: Every day | ORAL | Status: DC

## 2015-06-04 NOTE — Progress Notes (Signed)
Telephone call, reviewed TSH 0.09 currently on Synthroid 125 will reduced to Synthroid 112 g daily, proper use reviewed. Recheck TSH in 6 weeks. Denies any change in weight or other symptoms.

## 2015-06-17 ENCOUNTER — Encounter: Payer: Self-pay | Admitting: Women's Health

## 2015-06-17 ENCOUNTER — Ambulatory Visit (INDEPENDENT_AMBULATORY_CARE_PROVIDER_SITE_OTHER): Payer: Commercial Managed Care - PPO | Admitting: Women's Health

## 2015-06-17 VITALS — BP 130/80 | Wt 239.0 lb

## 2015-06-17 DIAGNOSIS — Z23 Encounter for immunization: Secondary | ICD-10-CM

## 2015-06-17 NOTE — Progress Notes (Signed)
Patient ID: Suzanne Beasley, female   DOB: 1970-08-30, 45 y.o.   MRN: [id number] Presents with several issues. Had a left wrist injury after lifting a heavy suitcase and handling her dog. Was seen at an urgent care was given a wrist brace. States pain is mostly resolved, full mobility. Using the wrist brace limited during the day. Denies numbness, swelling, tingling or skin color change. In the process of adopting cousins newborn son who is still in the hospital. Blood pressure was elevated at urgent care 156/101  Exam: Appears well. Left wrist full mobility, radial pulse palpable, warm to touch, no visible edema, erythema. BP 130/80  Resolving left wrist injury  Plan: Reviewed importance of avoiding lifting heavy objects, Motrin as needed for discomfort. Congratulations given on adoption of newborn son Suzanne Beasley. T dap given. Instructed to monitor blood pressure follow-up with primary care if greater than 130/80.

## 2015-08-05 ENCOUNTER — Telehealth: Payer: Self-pay | Admitting: *Deleted

## 2015-08-05 DIAGNOSIS — E039 Hypothyroidism, unspecified: Secondary | ICD-10-CM

## 2015-08-05 NOTE — Telephone Encounter (Signed)
Okay for refill of Synthroid 112 g #30 but will need to have repeat TSH. She can have drawn at her work and have faxed over to [place]. Ask her how we need to order?

## 2015-08-05 NOTE — Telephone Encounter (Signed)
Pt is out of synthroid 112 mcg, had labs done at her job and faxed. Okay to continue on same dose?  Please advise

## 2015-08-05 NOTE — Telephone Encounter (Signed)
Pt coming tomorrow for TSH level at 9:00am

## 2015-08-05 NOTE — Telephone Encounter (Signed)
We have TSH results?

## 2015-08-05 NOTE — Telephone Encounter (Signed)
There is a scanned document on 06/01/15

## 2015-08-06 ENCOUNTER — Other Ambulatory Visit: Payer: Commercial Managed Care - PPO

## 2015-08-06 DIAGNOSIS — E039 Hypothyroidism, unspecified: Secondary | ICD-10-CM

## 2015-08-06 LAB — TSH: TSH: 3.03 mIU/L

## 2015-08-07 ENCOUNTER — Other Ambulatory Visit: Payer: Self-pay | Admitting: Women's Health

## 2015-08-07 DIAGNOSIS — E038 Other specified hypothyroidism: Secondary | ICD-10-CM

## 2015-08-07 MED ORDER — LEVOTHYROXINE SODIUM 112 MCG PO TABS: 112.0000 ug | ORAL_TABLET | Freq: Every day | ORAL | Status: DC

## 2015-08-09 ENCOUNTER — Other Ambulatory Visit: Payer: Self-pay | Admitting: Women's Health

## 2015-08-09 DIAGNOSIS — E038 Other specified hypothyroidism: Secondary | ICD-10-CM

## 2015-08-09 MED ORDER — LEVOTHYROXINE SODIUM 112 MCG PO TABS
112.0000 ug | ORAL_TABLET | Freq: Every day | ORAL | Status: DC
Start: 2015-08-09 — End: 2015-12-17

## 2015-11-08 ENCOUNTER — Other Ambulatory Visit: Payer: Self-pay | Admitting: Women's Health

## 2015-11-08 DIAGNOSIS — Z1231 Encounter for screening mammogram for malignant neoplasm of breast: Secondary | ICD-10-CM

## 2015-11-15 ENCOUNTER — Ambulatory Visit
Admission: RE | Admit: 2015-11-15 | Discharge: 2015-11-15 | Disposition: A | Discharge status: Home / Self Care | Payer: Commercial Managed Care - PPO | Source: Ambulatory Visit | Attending: Women's Health | Admitting: Women's Health

## 2015-11-15 DIAGNOSIS — Z1231 Encounter for screening mammogram for malignant neoplasm of breast: Secondary | ICD-10-CM

## 2015-12-15 ENCOUNTER — Encounter: Payer: Commercial Managed Care - PPO | Admitting: Women's Health

## 2015-12-17 ENCOUNTER — Encounter: Payer: Self-pay | Admitting: Women's Health

## 2015-12-17 ENCOUNTER — Ambulatory Visit (INDEPENDENT_AMBULATORY_CARE_PROVIDER_SITE_OTHER): Payer: Commercial Managed Care - PPO | Admitting: Women's Health

## 2015-12-17 VITALS — BP 128/80 | Ht 64.0 in | Wt 251.0 lb

## 2015-12-17 DIAGNOSIS — Z01419 Encounter for gynecological examination (general) (routine) without abnormal findings: Secondary | ICD-10-CM

## 2015-12-17 DIAGNOSIS — E038 Other specified hypothyroidism: Secondary | ICD-10-CM

## 2015-12-17 LAB — TSH: TSH: 2.3 m[IU]/L

## 2015-12-17 MED ORDER — LEVOTHYROXINE SODIUM 112 MCG PO TABS: 112.0000 ug | ORAL_TABLET | Freq: Every day | ORAL | Status: DC

## 2015-12-17 NOTE — Patient Instructions (Signed)
Basic Carbohydrate Counting for Diabetes Mellitus Carbohydrate counting is a method for keeping track of the amount of carbohydrates you eat. Eating carbohydrates naturally increases the level of sugar (glucose) in your blood, so it is important for you to know the amount that is okay for you to have in every meal. Carbohydrate counting helps keep the level of glucose in your blood within normal limits. The amount of carbohydrates allowed is different for every person. A dietitian can help you calculate the amount that is right for you. Once you know the amount of carbohydrates you can have, you can count the carbohydrates in the foods you want to eat. Carbohydrates are found in the following foods:  Grains, such as breads and cereals.  Dried beans and soy products.  Starchy vegetables, such as potatoes, peas, and corn.  Fruit and fruit juices.  Milk and yogurt.  Sweets and snack foods, such as cake, cookies, candy, chips, soft drinks, and fruit drinks. CARBOHYDRATE COUNTING There are two ways to count the carbohydrates in your food. You can use either of the methods or a combination of both. Reading the "Nutrition Facts" on [place] The "Nutrition Facts" is an area that is included on the labels of almost all packaged food and beverages in the [place]. It includes the serving size of that food or beverage and information about the nutrients in each serving of the food, including the grams (g) of carbohydrate per serving.  Decide the number of servings of this food or beverage that you will be able to eat or drink. Multiply that number of servings by the number of grams of carbohydrate that is listed on the label for that serving. The total will be the amount of carbohydrates you will be having when you eat or drink this food or beverage. Learning Standard Serving Sizes of Food When you eat food that is not packaged or does not include "Nutrition Facts" on the label, you need to  measure the servings in order to count the amount of carbohydrates.A serving of most carbohydrate-rich foods contains about 15 g of carbohydrates. The following list includes serving sizes of carbohydrate-rich foods that provide 15 g ofcarbohydrate per serving:   1 slice of bread (1 oz) or 1 six-inch tortilla.    of a hamburger bun or English muffin.  4-6 crackers.   cup unsweetened dry cereal.    cup hot cereal.   cup rice or pasta.    cup mashed potatoes or  of a large baked potato.  1 cup fresh fruit or one small piece of fruit.    cup canned or frozen fruit or fruit juice.  1 cup milk.   cup plain fat-free yogurt or yogurt sweetened with artificial sweeteners.   cup cooked dried beans or starchy vegetable, such as peas, corn, or potatoes.  Decide the number of standard-size servings that you will eat. Multiply that number of servings by 15 (the grams of carbohydrates in that serving). For example, if you eat 2 cups of strawberries, you will have eaten 2 servings and 30 g of carbohydrates (2 servings x 15 g = 30 g). For foods such as soups and casseroles, in which more than one food is mixed in, you will need to count the carbohydrates in each food that is included. EXAMPLE OF CARBOHYDRATE COUNTING Sample Dinner  3 oz chicken breast.   cup of brown rice.   cup of corn.  1 cup milk.   1 cup strawberries with  sugar-free whipped topping.  Carbohydrate Calculation Step 1: Identify the foods that contain carbohydrates:   Rice.   Corn.   Milk.   Strawberries. Step 2:Calculate the number of servings eaten of each:   2 servings of rice.   1 serving of corn.   1 serving of milk.   1 serving of strawberries. Step 3: Multiply each of those number of servings by 15 g:   2 servings of rice x 15 g = 30 g.   1 serving of corn x 15 g = 15 g.   1 serving of milk x 15 g = 15 g.   1 serving of strawberries x 15 g = 15 g. Step 4: Add  together all of the amounts to find the total grams of carbohydrates eaten: 30 g + 15 g + 15 g + 15 g = 75 g.   This information is not intended to replace advice given to you by your health care provider. Make sure you discuss any questions you have with your health care provider.   Document Released: 05/22/2005 Document Revised: 06/12/2014 Document Reviewed: 04/18/2013 Elsevier Interactive Patient Education 2016 Heath Maintenance, Female Adopting a healthy lifestyle and getting preventive care can go a long way to promote health and wellness. Talk with your health care provider about what schedule of regular examinations is right for you. This is a good chance for you to check in with your provider about disease prevention and staying healthy. In between checkups, there are plenty of things you can do on your own. Experts have done a lot of research about which lifestyle changes and preventive measures are most likely to keep you healthy. Ask your health care provider for more information. WEIGHT AND DIET  Eat a healthy diet  Be sure to include plenty of vegetables, fruits, low-fat dairy products, and lean protein.  Do not eat a lot of foods high in solid fats, added sugars, or salt.  Get regular exercise. This is one of the most important things you can do for your health.  Most adults should exercise for at least 150 minutes each week. The exercise should increase your heart rate and make you sweat (moderate-intensity exercise).  Most adults should also do strengthening exercises at least twice a week. This is in addition to the moderate-intensity exercise.  Maintain a healthy weight  Body mass index (BMI) is a measurement that can be used to identify possible weight problems. It estimates body fat based on height and weight. Your health care provider can help determine your BMI and help you achieve or maintain a healthy weight.  For females 43 years of age and older:    A BMI below 18.5 is considered underweight.  A BMI of 18.5 to 24.9 is normal.  A BMI of 25 to 29.9 is considered overweight.  A BMI of 30 and above is considered obese.  Watch levels of cholesterol and blood lipids  You should start having your blood tested for lipids and cholesterol at 43 years of age, then have this test every 5 years.  You may need to have your cholesterol levels checked more often if:  Your lipid or cholesterol levels are high.  You are older than 43 years of age.  You are at high risk for heart disease.  CANCER SCREENING   Lung Cancer  Lung cancer screening is recommended for adults 12-76 years old who are at high risk for lung cancer because of a history of  smoking.  A yearly low-dose CT scan of the lungs is recommended for people who:  Currently smoke.  Have quit within the past 15 years.  Have at least a 30-pack-year history of smoking. A pack year is smoking an average of one pack of cigarettes a day for 1 year.  Yearly screening should continue until it has been 15 years since you quit.  Yearly screening should stop if you develop a health problem that would prevent you from having lung cancer treatment.  Breast Cancer  Practice breast self-awareness. This means understanding how your breasts normally appear and feel.  It also means doing regular breast self-exams. Let your health care provider know about any changes, no matter how small.  If you are in your 20s or 30s, you should have a clinical breast exam (CBE) by a health care provider every 1-3 years as part of a regular health exam.  If you are 66 or older, have a CBE every year. Also consider having a breast X-ray (mammogram) every year.  If you have a family history of breast cancer, talk to your health care provider about genetic screening.  If you are at high risk for breast cancer, talk to your health care provider about having an MRI and a mammogram every year.  Breast  cancer gene (BRCA) assessment is recommended for women who have family members with BRCA-related cancers. BRCA-related cancers include:  Breast.  Ovarian.  Tubal.  Peritoneal cancers.  Results of the assessment will determine the need for genetic counseling and BRCA1 and BRCA2 testing. Cervical Cancer Your health care provider may recommend that you be screened regularly for cancer of the pelvic organs (ovaries, uterus, and vagina). This screening involves a pelvic examination, including checking for microscopic changes to the surface of your cervix (Pap test). You may be encouraged to have this screening done every 3 years, beginning at age 24.  For women ages 17-65, health care providers may recommend pelvic exams and Pap testing every 3 years, or they may recommend the Pap and pelvic exam, combined with testing for human papilloma virus (HPV), every 5 years. Some types of HPV increase your risk of cervical cancer. Testing for HPV may also be done on women of any age with unclear Pap test results.  Other health care providers may not recommend any screening for nonpregnant women who are considered low risk for pelvic cancer and who do not have symptoms. Ask your health care provider if a screening pelvic exam is right for you.  If you have had past treatment for cervical cancer or a condition that could lead to cancer, you need Pap tests and screening for cancer for at least 20 years after your treatment. If Pap tests have been discontinued, your risk factors (such as having a new sexual partner) need to be reassessed to determine if screening should resume. Some women have medical problems that increase the chance of getting cervical cancer. In these cases, your health care provider may recommend more frequent screening and Pap tests. Colorectal Cancer  This type of cancer can be detected and often prevented.  Routine colorectal cancer screening usually begins at 43 years of age and  continues through 43 years of age.  Your health care provider may recommend screening at an earlier age if you have risk factors for colon cancer.  Your health care provider may also recommend using home test kits to check for hidden blood in the stool.  A small camera at the end  of a tube can be used to examine your colon directly (sigmoidoscopy or colonoscopy). This is done to check for the earliest forms of colorectal cancer.  Routine screening usually begins at age 50.  Direct examination of the colon should be repeated every 5-10 years through 43 years of age. However, you may need to be screened more often if early forms of precancerous polyps or small growths are found. Skin Cancer  Check your skin from head to toe regularly.  Tell your health care provider about any new moles or changes in moles, especially if there is a change in a mole's shape or color.  Also tell your health care provider if you have a mole that is larger than the size of a pencil eraser.  Always use sunscreen. Apply sunscreen liberally and repeatedly throughout the day.  Protect yourself by wearing long sleeves, pants, a wide-brimmed hat, and sunglasses whenever you are outside. HEART DISEASE, DIABETES, AND HIGH BLOOD PRESSURE   High blood pressure causes heart disease and increases the risk of stroke. High blood pressure is more likely to develop in:  People who have blood pressure in the high end of the normal range (130-139/85-89 mm Hg).  People who are overweight or obese.  People who are African American.  If you are 18-39 years of age, have your blood pressure checked every 3-5 years. If you are 40 years of age or older, have your blood pressure checked every year. You should have your blood pressure measured twice--once when you are at a hospital or clinic, and once when you are not at a hospital or clinic. Record the average of the two measurements. To check your blood pressure when you are not at  a hospital or clinic, you can use:  An automated blood pressure machine at a pharmacy.  A home blood pressure monitor.  If you are between 55 years and 79 years old, ask your health care provider if you should take aspirin to prevent strokes.  Have regular diabetes screenings. This involves taking a blood sample to check your fasting blood sugar level.  If you are at a normal weight and have a low risk for diabetes, have this test once every three years after 43 years of age.  If you are overweight and have a high risk for diabetes, consider being tested at a younger age or more often. PREVENTING INFECTION  Hepatitis B  If you have a higher risk for hepatitis B, you should be screened for this virus. You are considered at high risk for hepatitis B if:  You were born in a country where hepatitis B is common. Ask your health care provider which countries are considered high risk.  Your parents were born in a high-risk country, and you have not been immunized against hepatitis B (hepatitis B vaccine).  You have HIV or AIDS.  You use needles to inject street drugs.  You live with someone who has hepatitis B.  You have had sex with someone who has hepatitis B.  You get hemodialysis treatment.  You take certain medicines for conditions, including cancer, organ transplantation, and autoimmune conditions. Hepatitis C  Blood testing is recommended for:  Everyone born from 1945 through 1965.  Anyone with known risk factors for hepatitis C. Sexually transmitted infections (STIs)  You should be screened for sexually transmitted infections (STIs) including gonorrhea and chlamydia if:  You are sexually active and are younger than 43 years of age.  You are older than 43   years of age and your health care provider tells you that you are at risk for this type of infection.  Your sexual activity has changed since you were last screened and you are at an increased risk for chlamydia or  gonorrhea. Ask your health care provider if you are at risk.  If you do not have HIV, but are at risk, it may be recommended that you take a prescription medicine daily to prevent HIV infection. This is called pre-exposure prophylaxis (PrEP). You are considered at risk if:  You are sexually active and do not regularly use condoms or know the HIV status of your partner(s).  You take drugs by injection.  You are sexually active with a partner who has HIV. Talk with your health care provider about whether you are at high risk of being infected with HIV. If you choose to begin PrEP, you should first be tested for HIV. You should then be tested every 3 months for as long as you are taking PrEP.  PREGNANCY   If you are premenopausal and you may become pregnant, ask your health care provider about preconception counseling.  If you may become pregnant, take 400 to 800 micrograms (mcg) of folic acid every day.  If you want to prevent pregnancy, talk to your health care provider about birth control (contraception). OSTEOPOROSIS AND MENOPAUSE   Osteoporosis is a disease in which the bones lose minerals and strength with aging. This can result in serious bone fractures. Your risk for osteoporosis can be identified using a bone density scan.  If you are 65 years of age or older, or if you are at risk for osteoporosis and fractures, ask your health care provider if you should be screened.  Ask your health care provider whether you should take a calcium or vitamin D supplement to lower your risk for osteoporosis.  Menopause may have certain physical symptoms and risks.  Hormone replacement therapy may reduce some of these symptoms and risks. Talk to your health care provider about whether hormone replacement therapy is right for you.  HOME CARE INSTRUCTIONS   Schedule regular health, dental, and eye exams.  Stay current with your immunizations.   Do not use any tobacco products including  cigarettes, chewing tobacco, or electronic cigarettes.  If you are pregnant, do not drink alcohol.  If you are breastfeeding, limit how much and how often you drink alcohol.  Limit alcohol intake to no more than 1 drink per day for nonpregnant women. One drink equals 12 ounces of beer, 5 ounces of wine, or 1 ounces of hard liquor.  Do not use street drugs.  Do not share needles.  Ask your health care provider for help if you need support or information about quitting drugs.  Tell your health care provider if you often feel depressed.  Tell your health care provider if you have ever been abused or do not feel safe at home.   This information is not intended to replace advice given to you by your health care provider. Make sure you discuss any questions you have with your health care provider.   Document Released: 12/05/2010 Document Revised: 06/12/2014 Document Reviewed: 04/23/2013 Elsevier Interactive Patient Education 2016 Elsevier Inc.  

## 2015-12-17 NOTE — Progress Notes (Signed)
Suzanne Beasley [birth year] [id number]    History:    Presents for annual exam.  Cycles every 1-2 months /PCO S/ infertility. Has an adopted son who is 6 months Suzanne Beasley doing well. Mother deceased Aug 12, 2014, uterine cancer . 2009-08-12 myomectomy. Normal mammogram history, August 12, 2001 ASCUS with normal Paps after. 10/2014 D&E for missed AB.  Past medical history, past surgical history, family history and social history were all reviewed and documented in the EPIC chart. Works in [place]. Father diabetes.  ROS:  A ROS was performed and pertinent positives and negatives are included.  Exam:  Filed Vitals:   12/17/15 0809  BP: 128/80    General appearance:  Normal Thyroid:  Symmetrical, normal in size, without palpable masses or nodularity. Respiratory  Auscultation:  Clear without wheezing or rhonchi Cardiovascular  Auscultation:  Regular rate, without rubs, murmurs or gallops  Edema/varicosities:  Not grossly evident Abdominal  Soft,nontender, without masses, guarding or rebound.  Liver/spleen:  No organomegaly noted  Hernia:  None appreciated  Skin  Inspection:  Grossly normal   Breasts: Examined lying and sitting.     Right: Without masses, retractions, discharge or axillary adenopathy.     Left: Without masses, retractions, discharge or axillary adenopathy. Gentitourinary   Inguinal/mons:  Normal without inguinal adenopathy  External genitalia:  Normal  BUS/Urethra/Skene's glands:  Normal  Vagina:  Normal  Cervix:  Normal  Uterus:  8-10 weeks size fibroid uterus, shape and contour.  Midline and mobile  Adnexa/parametria:     Rt: Without masses or tenderness.   Lt: Without masses or tenderness.  Anus and perineum: Normal  Digital rectal exam: Normal sphincter tone without palpated masses or tenderness  Assessment/Plan:  46 y.o. WBF G1 P0 +1 adopted for annual exam with no complaints.  Cycles every 1-2 months/PCO S/infertility Fibroid uterus Obesity Hypothyroid on Synthroid   Plan:   Reports normal screening labs at work. TSH, UA, Pap normal 12-Aug-2013, new screening guidelines reviewed. Synthroid 112 prescription, proper use given and reviewed proper administration. SBE's, continue annual screening mammogram, calcium rich diet, MVI daily encouraged.  Reviewed importance of increasing exercise and decreasing calories for weight loss. Instructed to call if cycles space greater than 3 months. Declines contraception.    Suzanne Beasley [facility], 8:38 AM 12/17/2015

## 2015-12-18 LAB — URINALYSIS W MICROSCOPIC + REFLEX CULTURE
BILIRUBIN URINE: NEGATIVE
Bacteria, UA: NONE SEEN [HPF]
Casts: NONE SEEN [LPF]
Crystals: NONE SEEN [HPF]
GLUCOSE, UA: NEGATIVE
Hgb urine dipstick: NEGATIVE
KETONES UR: NEGATIVE
LEUKOCYTES UA: NEGATIVE
NITRITE: NEGATIVE
PH: 7 (ref 5.0–8.0)
PROTEIN: NEGATIVE
RBC / HPF: NONE SEEN RBC/HPF (ref <=2)
Specific Gravity, Urine: 1.01 (ref 1.001–1.035)
Squamous Epithelial / LPF: NONE SEEN [HPF] (ref <=5)
WBC, UA: NONE SEEN WBC/HPF (ref <=5)
YEAST: NONE SEEN [HPF]

## 2015-12-20 ENCOUNTER — Other Ambulatory Visit: Payer: Self-pay | Admitting: *Deleted

## 2015-12-20 DIAGNOSIS — E038 Other specified hypothyroidism: Secondary | ICD-10-CM

## 2015-12-20 MED ORDER — LEVOTHYROXINE SODIUM 112 MCG PO TABS: 112.0000 ug | ORAL_TABLET | Freq: Every day | ORAL | Status: DC

## 2016-10-18 ENCOUNTER — Encounter: Payer: Self-pay | Admitting: Gynecology

## 2016-11-06 ENCOUNTER — Other Ambulatory Visit: Payer: Self-pay | Admitting: Women's Health

## 2016-11-06 DIAGNOSIS — R5381 Other malaise: Secondary | ICD-10-CM

## 2016-11-07 ENCOUNTER — Ambulatory Visit: Payer: Commercial Managed Care - PPO | Admitting: Women's Health

## 2016-12-18 ENCOUNTER — Ambulatory Visit (INDEPENDENT_AMBULATORY_CARE_PROVIDER_SITE_OTHER): Payer: Commercial Managed Care - PPO | Admitting: Women's Health

## 2016-12-18 ENCOUNTER — Encounter: Payer: Self-pay | Admitting: Women's Health

## 2016-12-18 VITALS — BP 122/76 | Ht 64.0 in | Wt 239.0 lb

## 2016-12-18 DIAGNOSIS — Z1151 Encounter for screening for human papillomavirus (HPV): Secondary | ICD-10-CM

## 2016-12-18 DIAGNOSIS — E038 Other specified hypothyroidism: Secondary | ICD-10-CM

## 2016-12-18 DIAGNOSIS — Z01419 Encounter for gynecological examination (general) (routine) without abnormal findings: Secondary | ICD-10-CM | POA: Diagnosis not present

## 2016-12-18 MED ORDER — LEVOTHYROXINE SODIUM 112 MCG PO TABS: 112.0000 ug | ORAL_TABLET | Freq: Every day | ORAL | 4 refills | Status: DC

## 2016-12-18 NOTE — Patient Instructions (Signed)

## 2016-12-18 NOTE — Progress Notes (Signed)
Suzanne Beasley 10-Jul-1970 [id number]    History:    Presents for annual exam.  Cycles becoming more regular, modyly every month. History of PCO S with irregular cycles and infertility. Has adopted a son Suzanne Beasley 18 months doing well. 2003 ASCUS with normal Paps after. Normal  mammogram history. 2011 myomectomy. Hypothyroid on Synthroid 112 g.  Past medical history, past surgical history, family history and social history were all reviewed and documented in the EPIC chart. Works in [place]. Father diabetes. 10/2014 [place] after missed AB. Partner on disability, excellent with son..  ROS:  A ROS was performed and pertinent positives and negatives are included.  Exam:  Vitals:   12/18/16 1426  BP: 122/76  Weight: 239 lb (108.4 kg)  Height: 5' 4"  (1.626 m)   Body mass index is 41.02 kg/m.   General appearance:  Normal Thyroid:  Symmetrical, normal in size, without palpable masses or nodularity. Respiratory  Auscultation:  Clear without wheezing or rhonchi Cardiovascular  Auscultation:  Regular rate, without rubs, murmurs or gallops  Edema/varicosities:  Not grossly evident Abdominal  Soft,nontender, without masses, guarding or rebound.  Liver/spleen:  No organomegaly noted  Hernia:  None appreciated  Skin  Inspection:  Grossly normal   Breasts: Examined lying and sitting.     Right: Without masses, retractions, discharge or axillary adenopathy.     Left: Without masses, retractions, discharge or axillary adenopathy. Gentitourinary   Inguinal/mons:  Normal without inguinal adenopathy  External genitalia:  Normal  BUS/Urethra/Skene's glands:  Normal  Vagina:  Normal  Cervix:  Normal  Uterus:   normal in size, shape and contour.  Midline and mobile  Adnexa/parametria:     Rt: Without masses or tenderness.   Lt: Without masses or tenderness.  Anus and perineum: Normal  Digital rectal exam: Normal sphincter tone without palpated masses or tenderness  Assessment/Plan:  46 y.o. SBF  G1 P0 +1 adopted  for annual exam with no complaints.  Monthly cycle/infertility History of small fibroids Hypothyroid on Synthroid Obesity  Plan: SBE's, continue annual screening mammogram, exercise, decrease calories/carbs, calcium rich diet, MVI daily encouraged. Contraception options reviewed and declined. Synthroid 112 g by mouth daily proper use, prescription given. Will have labs drawn at work and faxed, CBC, lipid panel, TSH, CMP, Pap with HR HPV typing, new screening guidelines reviewed.  Huel Cote [facility], 5:16 PM 12/18/2016

## 2016-12-19 LAB — PAP, TP IMAGING W/ HPV RNA, RFLX HPV TYPE 16,18/45: HPV MRNA, HIGH RISK: NOT DETECTED

## 2016-12-19 LAB — URINALYSIS W MICROSCOPIC + REFLEX CULTURE
BACTERIA UA: NONE SEEN [HPF]
Bilirubin Urine: NEGATIVE
Casts: NONE SEEN [LPF]
GLUCOSE, UA: NEGATIVE
Ketones, ur: NEGATIVE
LEUKOCYTES UA: NEGATIVE
NITRITE: NEGATIVE
Protein, ur: NEGATIVE
RBC / HPF: NONE SEEN RBC/HPF (ref <=2)
Specific Gravity, Urine: 1.012 (ref 1.001–1.035)
YEAST: NONE SEEN [HPF]
pH: 6 (ref 5.0–8.0)

## 2016-12-21 LAB — URINE CULTURE

## 2016-12-22 ENCOUNTER — Other Ambulatory Visit: Payer: Self-pay | Admitting: *Deleted

## 2016-12-22 MED ORDER — NITROFURANTOIN MONOHYD MACRO 100 MG PO CAPS: 100.0000 mg | ORAL_CAPSULE | Freq: Two times a day (BID) | ORAL | 0 refills | Status: DC

## 2017-04-04 ENCOUNTER — Other Ambulatory Visit: Payer: Self-pay | Admitting: Women's Health

## 2017-04-04 DIAGNOSIS — Z1231 Encounter for screening mammogram for malignant neoplasm of breast: Secondary | ICD-10-CM

## 2017-05-01 ENCOUNTER — Ambulatory Visit
Admission: RE | Admit: 2017-05-01 | Discharge: 2017-05-01 | Disposition: A | Discharge status: Home / Self Care | Payer: Commercial Managed Care - PPO | Source: Ambulatory Visit | Attending: Women's Health | Admitting: Women's Health

## 2017-05-01 DIAGNOSIS — Z1231 Encounter for screening mammogram for malignant neoplasm of breast: Secondary | ICD-10-CM

## 2017-10-24 ENCOUNTER — Other Ambulatory Visit: Payer: Self-pay

## 2017-10-24 ENCOUNTER — Emergency Department (HOSPITAL_COMMUNITY)
Admission: EM | Admit: 2017-10-24 | Discharge: 2017-10-24 | Disposition: A | Discharge status: Home / Self Care | Payer: Commercial Managed Care - PPO | Attending: Emergency Medicine | Admitting: Emergency Medicine

## 2017-10-24 DIAGNOSIS — M791 Myalgia, unspecified site: Secondary | ICD-10-CM | POA: Diagnosis not present

## 2017-10-24 DIAGNOSIS — E039 Hypothyroidism, unspecified: Secondary | ICD-10-CM | POA: Insufficient documentation

## 2017-10-24 DIAGNOSIS — Z79899 Other long term (current) drug therapy: Secondary | ICD-10-CM | POA: Insufficient documentation

## 2017-10-24 DIAGNOSIS — Z91040 Latex allergy status: Secondary | ICD-10-CM | POA: Insufficient documentation

## 2017-10-24 DIAGNOSIS — M542 Cervicalgia: Secondary | ICD-10-CM | POA: Diagnosis present

## 2017-10-24 DIAGNOSIS — M7918 Myalgia, other site: Secondary | ICD-10-CM

## 2017-10-24 MED ORDER — METHOCARBAMOL 500 MG PO TABS: 500.0000 mg | ORAL_TABLET | Freq: Two times a day (BID) | ORAL | 0 refills | Status: DC
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
## 2017-10-24 NOTE — Discharge Instructions (Addendum)
Please read attached information. If you experience any new or worsening signs or symptoms please return to the emergency room for evaluation. Please follow-up with your primary care provider or specialist as discussed. Please use medication prescribed only as directed and discontinue taking if you have any concerning signs or symptoms.   °

## 2017-10-24 NOTE — ED Notes (Signed)
Pt given two heat packs for neck/upper back
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
## 2017-10-24 NOTE — ED Notes (Signed)
Jeff PA at bedside.  

## 2017-12-19 ENCOUNTER — Encounter: Payer: Commercial Managed Care - PPO | Admitting: Women's Health

## 2017-12-25 ENCOUNTER — Encounter: Payer: Commercial Managed Care - PPO | Admitting: Women's Health

## 2018-01-05 ENCOUNTER — Other Ambulatory Visit: Payer: Self-pay | Admitting: Women's Health

## 2018-01-05 DIAGNOSIS — E038 Other specified hypothyroidism: Secondary | ICD-10-CM

## 2018-01-07 NOTE — Telephone Encounter (Signed)
Annual exam on 01/22/18

## 2018-01-22 ENCOUNTER — Encounter: Payer: Commercial Managed Care - PPO | Admitting: Women's Health

## 2018-02-06 ENCOUNTER — Encounter: Payer: Self-pay | Admitting: Women's Health

## 2018-02-06 ENCOUNTER — Ambulatory Visit (INDEPENDENT_AMBULATORY_CARE_PROVIDER_SITE_OTHER): Payer: Commercial Managed Care - PPO | Admitting: Women's Health

## 2018-02-06 VITALS — BP 128/80 | Wt 243.0 lb

## 2018-02-06 DIAGNOSIS — E038 Other specified hypothyroidism: Secondary | ICD-10-CM

## 2018-02-06 DIAGNOSIS — Z01419 Encounter for gynecological examination (general) (routine) without abnormal findings: Secondary | ICD-10-CM | POA: Diagnosis not present

## 2018-02-06 MED ORDER — LEVOTHYROXINE SODIUM 112 MCG PO TABS: ORAL_TABLET | ORAL | 4 refills | Status: DC

## 2018-02-06 NOTE — Progress Notes (Signed)
Suzanne Beasley 1970/10/16 [id number]    History:    Presents for annual exam.  Having regular monthly cycles using no contraception.  Partner disabled, erectile issues, pregnancy okay if occurs.  Normal Pap and mammogram history.  2011 myomectomy.  History of PCOS with irregular cycles with amenorrhea in the past.  2016  missed AB, D&E.Marland Kitchen  Hypothyroid on 112 mcg daily.  Past medical history, past surgical history, family history and social history were all reviewed and documented in the EPIC chart.  Planning marriage January 04, 2019.  Adopted son [name] 2-1/2 doing well.  Parents deceased, mother uterine cancer. Desk Job works in [place]:  A ROS was performed and pertinent positives and negatives are included.  Exam:  Vitals:   02/06/18 1205  BP: 128/80  Weight: 243 lb (110.2 kg)   Body mass index is 41.71 kg/m.   General appearance:  Normal Thyroid:  Symmetrical, normal in size, without palpable masses or nodularity. Respiratory  Auscultation:  Clear without wheezing or rhonchi Cardiovascular  Auscultation:  Regular rate, without rubs, murmurs or gallops  Edema/varicosities:  Not grossly evident Abdominal  Soft,nontender, without masses, guarding or rebound.  Liver/spleen:  No organomegaly noted  Hernia:  None appreciated  Skin  Inspection:  Grossly normal   Breasts: Examined lying and sitting.     Right: Without masses, retractions, discharge or axillary adenopathy.     Left: Without masses, retractions, discharge or axillary adenopathy. Gentitourinary   Inguinal/mons:  Normal without inguinal adenopathy  External genitalia:  Normal  BUS/Urethra/Skene's glands:  Normal  Vagina:  Normal  Cervix:  Normal  Uterus: normal in size, shape and contour.  Midline and mobile  Adnexa/parametria:     Rt: Without masses or tenderness.   Lt: Without masses or tenderness.  Anus and perineum: Normal  Digital rectal exam: Normal sphincter tone without palpated masses or  tenderness  Assessment/Plan:  47 y.o. engaged BF G1, P0 +1 adopted for annual exam with no complaints.  Monthly cycle/no contraception/pregnancy okay Hypothyroid on Synthroid Obesity  Plan: Synthroid 112 mcg p.o. daily proper use and administration reviewed.  Reviewed importance of decreasing calorie/carbs and increasing exercise.  SBE's, continue annual screening mammogram, calcium rich foods, vitamin D 1000 daily encouraged.  Continue multivitamin daily.  CBC, CMP, TSH, Pap normal 2018, new screening guidelines reviewed.  [place], 12:47 PM 02/06/2018

## 2018-02-06 NOTE — Patient Instructions (Signed)
Health Maintenance, Female Adopting a healthy lifestyle and getting preventive care can go a long way to promote health and wellness. Talk with your health care provider about what schedule of regular examinations is right for you. This is a good chance for you to check in with your provider about disease prevention and staying healthy. In between checkups, there are plenty of things you can do on your own. Experts have done a lot of research about which lifestyle changes and preventive measures are most likely to keep you healthy. Ask your health care provider for more information. Weight and diet Eat a healthy diet  Be sure to include plenty of vegetables, fruits, low-fat dairy products, and lean protein.  Do not eat a lot of foods high in solid fats, added sugars, or salt.  Get regular exercise. This is one of the most important things you can do for your health. ? Most adults should exercise for at least 150 minutes each week. The exercise should increase your heart rate and make you sweat (moderate-intensity exercise). ? Most adults should also do strengthening exercises at least twice a week. This is in addition to the moderate-intensity exercise.  Maintain a healthy weight  Body mass index (BMI) is a measurement that can be used to identify possible weight problems. It estimates body fat based on height and weight. Your health care provider can help determine your BMI and help you achieve or maintain a healthy weight.  For females 20 years of age and older: ? A BMI below 18.5 is considered underweight. ? A BMI of 18.5 to 24.9 is normal. ? A BMI of 25 to 29.9 is considered overweight. ? A BMI of 30 and above is considered obese.  Watch levels of cholesterol and blood lipids  You should start having your blood tested for lipids and cholesterol at 45 years of age, then have this test every 5 years.  You may need to have your cholesterol levels checked more often if: ? Your lipid or  cholesterol levels are high. ? You are older than 45 years of age. ? You are at high risk for heart disease.  Cancer screening Lung Cancer  Lung cancer screening is recommended for adults 55-80 years old who are at high risk for lung cancer because of a history of smoking.  A yearly low-dose CT scan of the lungs is recommended for people who: ? Currently smoke. ? Have quit within the past 15 years. ? Have at least a 30-pack-year history of smoking. A pack year is smoking an average of one pack of cigarettes a day for 1 year.  Yearly screening should continue until it has been 15 years since you quit.  Yearly screening should stop if you develop a health problem that would prevent you from having lung cancer treatment.  Breast Cancer  Practice breast self-awareness. This means understanding how your breasts normally appear and feel.  It also means doing regular breast self-exams. Let your health care provider know about any changes, no matter how small.  If you are in your 20s or 30s, you should have a clinical breast exam (CBE) by a health care provider every 1-3 years as part of a regular health exam.  If you are 40 or older, have a CBE every year. Also consider having a breast X-ray (mammogram) every year.  If you have a family history of breast cancer, talk to your health care provider about genetic screening.  If you are at high risk   for breast cancer, talk to your health care provider about having an MRI and a mammogram every year.  Breast cancer gene (BRCA) assessment is recommended for women who have family members with BRCA-related cancers. BRCA-related cancers include: ? Breast. ? Ovarian. ? Tubal. ? Peritoneal cancers.  Results of the assessment will determine the need for genetic counseling and BRCA1 and BRCA2 testing.  Cervical Cancer Your health care provider may recommend that you be screened regularly for cancer of the pelvic organs (ovaries, uterus, and  vagina). This screening involves a pelvic examination, including checking for microscopic changes to the surface of your cervix (Pap test). You may be encouraged to have this screening done every 3 years, beginning at age 22.  For women ages 56-65, health care providers may recommend pelvic exams and Pap testing every 3 years, or they may recommend the Pap and pelvic exam, combined with testing for human papilloma virus (HPV), every 5 years. Some types of HPV increase your risk of cervical cancer. Testing for HPV may also be done on women of any age with unclear Pap test results.  Other health care providers may not recommend any screening for nonpregnant women who are considered low risk for pelvic cancer and who do not have symptoms. Ask your health care provider if a screening pelvic exam is right for you.  If you have had past treatment for cervical cancer or a condition that could lead to cancer, you need Pap tests and screening for cancer for at least 20 years after your treatment. If Pap tests have been discontinued, your risk factors (such as having a new sexual partner) need to be reassessed to determine if screening should resume. Some women have medical problems that increase the chance of getting cervical cancer. In these cases, your health care provider may recommend more frequent screening and Pap tests.  Colorectal Cancer  This type of cancer can be detected and often prevented.  Routine colorectal cancer screening usually begins at 45 years of age and continues through 45 years of age.  Your health care provider may recommend screening at an earlier age if you have risk factors for colon cancer.  Your health care provider may also recommend using home test kits to check for hidden blood in the stool.  A small camera at the end of a tube can be used to examine your colon directly (sigmoidoscopy or colonoscopy). This is done to check for the earliest forms of colorectal  cancer.  Routine screening usually begins at age 33.  Direct examination of the colon should be repeated every 5-10 years through 45 years of age. However, you may need to be screened more often if early forms of precancerous polyps or small growths are found.  Skin Cancer  Check your skin from head to toe regularly.  Tell your health care provider about any new moles or changes in moles, especially if there is a change in a mole's shape or color.  Also tell your health care provider if you have a mole that is larger than the size of a pencil eraser.  Always use sunscreen. Apply sunscreen liberally and repeatedly throughout the day.  Protect yourself by wearing long sleeves, pants, a wide-brimmed hat, and sunglasses whenever you are outside.  Heart disease, diabetes, and high blood pressure  High blood pressure causes heart disease and increases the risk of stroke. High blood pressure is more likely to develop in: ? People who have blood pressure in the high end of  the normal range (130-139/85-89 mm Hg). ? People who are overweight or obese. ? People who are African American.  If you are 21-29 years of age, have your blood pressure checked every 3-5 years. If you are 3 years of age or older, have your blood pressure checked every year. You should have your blood pressure measured twice-once when you are at a hospital or clinic, and once when you are not at a hospital or clinic. Record the average of the two measurements. To check your blood pressure when you are not at a hospital or clinic, you can use: ? An automated blood pressure machine at a pharmacy. ? A home blood pressure monitor.  If you are between 17 years and 37 years old, ask your health care provider if you should take aspirin to prevent strokes.  Have regular diabetes screenings. This involves taking a blood sample to check your fasting blood sugar level. ? If you are at a normal weight and have a low risk for diabetes,  have this test once every three years after 45 years of age. ? If you are overweight and have a high risk for diabetes, consider being tested at a younger age or more often. Preventing infection Hepatitis B  If you have a higher risk for hepatitis B, you should be screened for this virus. You are considered at high risk for hepatitis B if: ? You were born in a country where hepatitis B is common. Ask your health care provider which countries are considered high risk. ? Your parents were born in a high-risk country, and you have not been immunized against hepatitis B (hepatitis B vaccine). ? You have HIV or AIDS. ? You use needles to inject street drugs. ? You live with someone who has hepatitis B. ? You have had sex with someone who has hepatitis B. ? You get hemodialysis treatment. ? You take certain medicines for conditions, including cancer, organ transplantation, and autoimmune conditions.  Hepatitis C  Blood testing is recommended for: ? Everyone born from [birth year] through 1963. ? Anyone with known risk factors for hepatitis C.  Sexually transmitted infections (STIs)  You should be screened for sexually transmitted infections (STIs) including gonorrhea and chlamydia if: ? You are sexually active and are younger than 45 years of age. ? You are older than 45 years of age and your health care provider tells you that you are at risk for this type of infection. ? Your sexual activity has changed since you were last screened and you are at an increased risk for chlamydia or gonorrhea. Ask your health care provider if you are at risk.  If you do not have HIV, but are at risk, it may be recommended that you take a prescription medicine daily to prevent HIV infection. This is called pre-exposure prophylaxis (PrEP). You are considered at risk if: ? You are sexually active and do not regularly use condoms or know the HIV status of your partner(s). ? You take drugs by injection. ? You are  sexually active with a partner who has HIV.  Talk with your health care provider about whether you are at high risk of being infected with HIV. If you choose to begin PrEP, you should first be tested for HIV. You should then be tested every 3 months for as long as you are taking PrEP. Pregnancy  If you are premenopausal and you may become pregnant, ask your health care provider about preconception counseling.  If you may become  pregnant, take 400 to 800 micrograms (mcg) of folic acid every day.  If you want to prevent pregnancy, talk to your health care provider about birth control (contraception). Osteoporosis and menopause  Osteoporosis is a disease in which the bones lose minerals and strength with aging. This can result in serious bone fractures. Your risk for osteoporosis can be identified using a bone density scan.  If you are 65 years of age or older, or if you are at risk for osteoporosis and fractures, ask your health care provider if you should be screened.  Ask your health care provider whether you should take a calcium or vitamin D supplement to lower your risk for osteoporosis.  Menopause may have certain physical symptoms and risks.  Hormone replacement therapy may reduce some of these symptoms and risks. Talk to your health care provider about whether hormone replacement therapy is right for you. Follow these instructions at home:  Schedule regular health, dental, and eye exams.  Stay current with your immunizations.  Do not use any tobacco products including cigarettes, chewing tobacco, or electronic cigarettes.  If you are pregnant, do not drink alcohol.  If you are breastfeeding, limit how much and how often you drink alcohol.  Limit alcohol intake to no more than 1 drink per day for nonpregnant women. One drink equals 12 ounces of beer, 5 ounces of wine, or 1 ounces of hard liquor.  Do not use street drugs.  Do not share needles.  Ask your health care  provider for help if you need support or information about quitting drugs.  Tell your health care provider if you often feel depressed.  Tell your health care provider if you have ever been abused or do not feel safe at home. This information is not intended to replace advice given to you by your health care provider. Make sure you discuss any questions you have with your health care provider. Document Released: 12/05/2010 Document Revised: 10/28/2015 Document Reviewed: 02/23/2015 Elsevier Interactive Patient Education  2018 [employer] Carbohydrate Counting for Diabetes Mellitus, Adult Carbohydrate counting is a method for keeping track of how many carbohydrates you eat. Eating carbohydrates naturally increases the amount of sugar (glucose) in the blood. Counting how many carbohydrates you eat helps keep your blood glucose within normal limits, which helps you manage your diabetes (diabetes mellitus). It is important to know how many carbohydrates you can safely have in each meal. This is different for every person. A diet and nutrition specialist (registered dietitian) can help you make a meal plan and calculate how many carbohydrates you should have at each meal and snack. Carbohydrates are found in the following foods:  Grains, such as breads and cereals.  Dried beans and soy products.  Starchy vegetables, such as potatoes, peas, and corn.  Fruit and fruit juices.  Milk and yogurt.  Sweets and snack foods, such as cake, cookies, candy, chips, and soft drinks.  How do I count carbohydrates? There are two ways to count carbohydrates in food. You can use either of the methods or a combination of both. Reading "Nutrition Facts" on packaged food The "Nutrition Facts" list is included on the labels of almost all packaged foods and beverages in the [place] It includes:  The serving size.  Information about nutrients in each serving, including the grams (g) of carbohydrate per  serving.  To use the "Nutrition Facts":  Decide how many servings you will have.  Multiply the number of servings by the number of carbohydrates   per serving.  The resulting number is the total amount of carbohydrates that you will be having.  Learning standard serving sizes of other foods When you eat foods containing carbohydrates that are not packaged or do not include "Nutrition Facts" on the label, you need to measure the servings in order to count the amount of carbohydrates:  Measure the foods that you will eat with a food scale or measuring cup, if needed.  Decide how many standard-size servings you will eat.  Multiply the number of servings by 15. Most carbohydrate-rich foods have about 15 g of carbohydrates per serving. ? For example, if you eat 8 oz (170 g) of strawberries, you will have eaten 2 servings and 30 g of carbohydrates (2 servings x 15 g = 30 g).  For foods that have more than one food mixed, such as soups and casseroles, you must count the carbohydrates in each food that is included.  The following list contains standard serving sizes of common carbohydrate-rich foods. Each of these servings has about 15 g of carbohydrates:   hamburger bun or  English muffin.   oz (15 mL) syrup.   oz (14 g) jelly.  1 slice of bread.  1 six-inch tortilla.  3 oz (85 g) cooked rice or pasta.  4 oz (113 g) cooked dried beans.  4 oz (113 g) starchy vegetable, such as peas, corn, or potatoes.  4 oz (113 g) hot cereal.  4 oz (113 g) mashed potatoes or  of a large baked potato.  4 oz (113 g) canned or frozen fruit.  4 oz (120 mL) fruit juice.  4-6 crackers.  6 chicken nuggets.  6 oz (170 g) unsweetened dry cereal.  6 oz (170 g) plain fat-free yogurt or yogurt sweetened with artificial sweeteners.  8 oz (240 mL) milk.  8 oz (170 g) fresh fruit or one small piece of fruit.  24 oz (680 g) popped popcorn.  Example of carbohydrate counting Sample meal  3  oz (85 g) chicken breast.  6 oz (170 g) brown rice.  4 oz (113 g) corn.  8 oz (240 mL) milk.  8 oz (170 g) strawberries with sugar-free whipped topping. Carbohydrate calculation 1. Identify the foods that contain carbohydrates: ? Rice. ? Corn. ? Milk. ? Strawberries. 2. Calculate how many servings you have of each food: ? 2 servings rice. ? 1 serving corn. ? 1 serving milk. ? 1 serving strawberries. 3. Multiply each number of servings by 15 g: ? 2 servings rice x 15 g = 30 g. ? 1 serving corn x 15 g = 15 g. ? 1 serving milk x 15 g = 15 g. ? 1 serving strawberries x 15 g = 15 g. 4. Add together all of the amounts to find the total grams of carbohydrates eaten: ? 30 g + 15 g + 15 g + 15 g = 75 g of carbohydrates total. This information is not intended to replace advice given to you by your health care provider. Make sure you discuss any questions you have with your health care provider. Document Released: 05/22/2005 Document Revised: 12/10/2015 Document Reviewed: 11/03/2015 Elsevier Interactive Patient Education  [employer].

## 2018-02-07 ENCOUNTER — Other Ambulatory Visit: Payer: Self-pay | Admitting: Women's Health

## 2018-02-07 DIAGNOSIS — R7989 Other specified abnormal findings of blood chemistry: Secondary | ICD-10-CM

## 2018-02-07 DIAGNOSIS — E038 Other specified hypothyroidism: Secondary | ICD-10-CM

## 2018-02-07 LAB — COMPREHENSIVE METABOLIC PANEL
AG Ratio: 1.3 (calc) (ref 1.0–2.5)
ALKALINE PHOSPHATASE (APISO): 45 U/L (ref 33–115)
ALT: 12 U/L (ref 6–29)
AST: 15 U/L (ref 10–30)
Albumin: 4 g/dL (ref 3.6–5.1)
BUN / CREAT RATIO: 9 (calc) (ref 6–22)
BUN: 10 mg/dL (ref 7–25)
CO2: 26 mmol/L (ref 20–32)
Calcium: 9 mg/dL (ref 8.6–10.2)
Chloride: 103 mmol/L (ref 98–110)
Creat: 1.13 mg/dL — ABNORMAL HIGH (ref 0.50–1.10)
GLOBULIN: 3 g/dL (ref 1.9–3.7)
Glucose, Bld: 86 mg/dL (ref 65–99)
Potassium: 3.9 mmol/L (ref 3.5–5.3)
Sodium: 136 mmol/L (ref 135–146)
Total Bilirubin: 0.5 mg/dL (ref 0.2–1.2)
Total Protein: 7 g/dL (ref 6.1–8.1)

## 2018-02-07 LAB — CBC WITH DIFFERENTIAL/PLATELET
BASOS ABS: 51 {cells}/uL (ref 0–200)
Basophils Relative: 0.9 %
EOS ABS: 91 {cells}/uL (ref 15–500)
Eosinophils Relative: 1.6 %
HCT: 40.4 % (ref 35.0–45.0)
Hemoglobin: 13.3 g/dL (ref 11.7–15.5)
Lymphs Abs: 2052 cells/uL (ref 850–3900)
MCH: 29.6 pg (ref 27.0–33.0)
MCHC: 32.9 g/dL (ref 32.0–36.0)
MCV: 90 fL (ref 80.0–100.0)
MPV: 11.1 fL (ref 7.5–12.5)
Monocytes Relative: 9.7 %
NEUTROS PCT: 51.8 %
Neutro Abs: 2953 cells/uL (ref 1500–7800)
Platelets: 258 10*3/uL (ref 140–400)
RBC: 4.49 10*6/uL (ref 3.80–5.10)
RDW: 12.7 % (ref 11.0–15.0)
TOTAL LYMPHOCYTE: 36 %
WBC mixed population: 553 cells/uL (ref 200–950)
WBC: 5.7 10*3/uL (ref 3.8–10.8)

## 2018-02-07 LAB — TSH: TSH: 4.67 mIU/L — ABNORMAL HIGH

## 2018-02-07 MED ORDER — LEVOTHYROXINE SODIUM 125 MCG PO TABS: 125.0000 ug | ORAL_TABLET | Freq: Every day | ORAL | 1 refills | Status: DC

## 2018-04-05 ENCOUNTER — Other Ambulatory Visit: Payer: Self-pay | Admitting: Women's Health

## 2018-04-06 ENCOUNTER — Other Ambulatory Visit: Payer: Self-pay | Admitting: Women's Health

## 2018-04-07 ENCOUNTER — Other Ambulatory Visit: Payer: Self-pay | Admitting: Women's Health

## 2018-04-11 ENCOUNTER — Telehealth: Payer: Self-pay

## 2018-04-11 MED ORDER — LEVOTHYROXINE SODIUM 125 MCG PO TABS: 125.0000 ug | ORAL_TABLET | Freq: Every day | ORAL | 0 refills | Status: DC

## 2018-04-11 NOTE — Telephone Encounter (Signed)
Yes ok for refill, ask if she can have checked at work and fax [place] results?  If not NEEDs the  Blood work prior to next refill.  thanks

## 2018-04-11 NOTE — Telephone Encounter (Signed)
Patient called because she needs Synthroid refill and only has one pill left. I called and per DPR access note on file I left detailed message reminding her of our conversation on 02/07/18 and her need to repeat labs TSH and creatinine.  Notes recorded by Ramond Craver, RMA on 02/07/2018 at 2:40 PM EDT Spoke with patient. She said she has been taking her Levothyroxine every day. New Rx sent for the 125 mcg. . Patient advised to recheck TSH and creatinine level in 6 weeks. Orders placed.  I did tell her I would send one 30 day refill but she must come for labwork in that time so we can be sure she is on the correct dose on Synthroid.

## 2018-05-11 ENCOUNTER — Other Ambulatory Visit: Payer: Self-pay | Admitting: Women's Health

## 2018-05-17 ENCOUNTER — Other Ambulatory Visit: Payer: Commercial Managed Care - PPO

## 2018-05-17 DIAGNOSIS — R7989 Other specified abnormal findings of blood chemistry: Secondary | ICD-10-CM

## 2018-05-17 DIAGNOSIS — E038 Other specified hypothyroidism: Secondary | ICD-10-CM

## 2018-05-17 LAB — TSH: TSH: 2.07 mIU/L

## 2018-05-17 LAB — CREATININE, SERUM: Creat: 1.04 mg/dL (ref 0.50–1.10)

## 2018-05-19 ENCOUNTER — Other Ambulatory Visit: Payer: Self-pay | Admitting: Women's Health

## 2019-02-18 ENCOUNTER — Other Ambulatory Visit: Payer: Self-pay

## 2019-02-19 ENCOUNTER — Encounter: Payer: Self-pay | Admitting: Women's Health

## 2019-02-19 ENCOUNTER — Other Ambulatory Visit: Payer: Self-pay | Admitting: Women's Health

## 2019-02-19 ENCOUNTER — Ambulatory Visit (INDEPENDENT_AMBULATORY_CARE_PROVIDER_SITE_OTHER): Payer: Commercial Managed Care - PPO | Admitting: Women's Health

## 2019-02-19 VITALS — BP 126/78 | Ht 64.0 in | Wt 254.0 lb

## 2019-02-19 DIAGNOSIS — Z01419 Encounter for gynecological examination (general) (routine) without abnormal findings: Secondary | ICD-10-CM | POA: Diagnosis not present

## 2019-02-19 DIAGNOSIS — E038 Other specified hypothyroidism: Secondary | ICD-10-CM

## 2019-02-19 DIAGNOSIS — Z1231 Encounter for screening mammogram for malignant neoplasm of breast: Secondary | ICD-10-CM

## 2019-02-19 MED ORDER — LEVOTHYROXINE SODIUM 125 MCG PO TABS: ORAL_TABLET | ORAL | 4 refills | Status: DC

## 2019-02-19 NOTE — Progress Notes (Signed)
Suzanne Beasley [birth year] [id number]    History:    Presents for annual exam.  Regular monthly cycle using no contraception pregnancy okay.  Has an adopted 17-1/46-year-old son [name] thriving.  History of a missed AB had a D&E.  Hypothyroid.  2011 myomectomy.  History of PCOS.  Normal Pap and mammogram history overdue for mammogram.  Mother deceased from uterine cancer.  Past medical history, past surgical history, family history and social history were all reviewed and documented in the EPIC chart.  Married this past year husband has health issues unable to walk but able to do many things around the house.  Works in [place]. Father deceased stomach aneurysm.  ROS:  A ROS was performed and pertinent positives and negatives are included.  Exam:  Vitals:   02/19/19 1522  BP: 126/78  Weight: 254 lb (115.2 kg)  Height: 5' 4"  (1.626 m)   Body mass index is 43.6 kg/m.   General appearance:  Normal Thyroid:  Symmetrical, normal in size, without palpable masses or nodularity. Respiratory  Auscultation:  Clear without wheezing or rhonchi Cardiovascular  Auscultation:  Regular rate, without rubs, murmurs or gallops  Edema/varicosities:  Not grossly evident Abdominal  Soft,nontender, without masses, guarding or rebound.  Liver/spleen:  No organomegaly noted  Hernia:  None appreciated  Skin  Inspection:  Grossly normal   Breasts: Examined lying and sitting.     Right: Without masses, retractions, discharge or axillary adenopathy.     Left: Without masses, retractions, discharge or axillary adenopathy. Gentitourinary   Inguinal/mons:  Normal without inguinal adenopathy  External genitalia:  Normal  BUS/Urethra/Skene's glands:  Normal  Vagina:  Normal  Cervix:  Normal  Uterus:   normal in size, shape and contour.  Midline and mobile  Adnexa/parametria:     Rt: Without masses or tenderness.   Lt: Without masses or tenderness.  Anus and perineum: Normal  Digital rectal exam: Normal  sphincter tone without palpated masses or tenderness  Assessment/Plan:  48 y.o. MBF G1, P0 +1 adopted for annual exam with no complaints.  Monthly cycle/no contraception pregnancy okay Hypothyroid on Synthroid 2011 myomectomy History of PCOS Morbid obesity  Plan: Synthroid 125 mcg p.o. daily proper use and administration reviewed.  SBEs, overdue for mammogram instructed to schedule breast center information given.  Reviewed importance of increasing regular cardio type exercise and decreasing calorie/carbs.  Calcium rich foods, vitamin D 1000 daily and MVI daily encouraged.  CBC, CMP, TSH, T4, Pap normal 2018, new screening guidelines reviewed.  Instructed to get  fasting lipid panel at work and faxed to our office.    [place], 4:30 PM 02/19/2019

## 2019-02-19 NOTE — Patient Instructions (Signed)
Check fasting lipid panel at work  Health Maintenance, Female Adopting a healthy lifestyle and getting preventive care are important in promoting health and wellness. Ask your health care provider about:  The right schedule for you to have regular tests and exams.  Things you can do on your own to prevent diseases and keep yourself healthy. What should I know about diet, weight, and exercise? Eat a healthy diet   Eat a diet that includes plenty of vegetables, fruits, low-fat dairy products, and lean protein.  Do not eat a lot of foods that are high in solid fats, added sugars, or sodium. Maintain a healthy weight Body mass index (BMI) is used to identify weight problems. It estimates body fat based on height and weight. Your health care provider can help determine your BMI and help you achieve or maintain a healthy weight. Get regular exercise Get regular exercise. This is one of the most important things you can do for your health. Most adults should:  Exercise for at least 150 minutes each week. The exercise should increase your heart rate and make you sweat (moderate-intensity exercise).  Do strengthening exercises at least twice a week. This is in addition to the moderate-intensity exercise.  Spend less time sitting. Even light physical activity can be beneficial. Watch cholesterol and blood lipids Have your blood tested for lipids and cholesterol at 46 years of age, then have this test every 5 years. Have your cholesterol levels checked more often if:  Your lipid or cholesterol levels are high.  You are older than 46 years of age.  You are at high risk for heart disease. What should I know about cancer screening? Depending on your health history and family history, you may need to have cancer screening at various ages. This may include screening for:  Breast cancer.  Cervical cancer.  Colorectal cancer.  Skin cancer.  Lung cancer. What should I know about heart  disease, diabetes, and high blood pressure? Blood pressure and heart disease  High blood pressure causes heart disease and increases the risk of stroke. This is more likely to develop in people who have high blood pressure readings, are of African descent, or are overweight.  Have your blood pressure checked: ? Every 3-5 years if you are 75-60 years of age. ? Every year if you are 93 years old or older. Diabetes Have regular diabetes screenings. This checks your fasting blood sugar level. Have the screening done:  Once every three years after age 25 if you are at a normal weight and have a low risk for diabetes.  More often and at a younger age if you are overweight or have a high risk for diabetes. What should I know about preventing infection? Hepatitis B If you have a higher risk for hepatitis B, you should be screened for this virus. Talk with your health care provider to find out if you are at risk for hepatitis B infection. Hepatitis C Testing is recommended for:  Everyone born from [birth year] through 1965.  Anyone with known risk factors for hepatitis C. Sexually transmitted infections (STIs)  Get screened for STIs, including gonorrhea and chlamydia, if: ? You are sexually active and are younger than 46 years of age. ? You are older than 46 years of age and your health care provider tells you that you are at risk for this type of infection. ? Your sexual activity has changed since you were last screened, and you are at increased risk for chlamydia or  gonorrhea. Ask your health care provider if you are at risk.  Ask your health care provider about whether you are at high risk for HIV. Your health care provider may recommend a prescription medicine to help prevent HIV infection. If you choose to take medicine to prevent HIV, you should first get tested for HIV. You should then be tested every 3 months for as long as you are taking the medicine. Pregnancy  If you are about to stop  having your period (premenopausal) and you may become pregnant, seek counseling before you get pregnant.  Take 400 to 800 micrograms (mcg) of folic acid every day if you become pregnant.  Ask for birth control (contraception) if you want to prevent pregnancy. Osteoporosis and menopause Osteoporosis is a disease in which the bones lose minerals and strength with aging. This can result in bone fractures. If you are 80 years old or older, or if you are at risk for osteoporosis and fractures, ask your health care provider if you should:  Be screened for bone loss.  Take a calcium or vitamin D supplement to lower your risk of fractures.  Be given hormone replacement therapy (HRT) to treat symptoms of menopause. Follow these instructions at home: Lifestyle  Do not use any products that contain nicotine or tobacco, such as cigarettes, e-cigarettes, and chewing tobacco. If you need help quitting, ask your health care provider.  Do not use street drugs.  Do not share needles.  Ask your health care provider for help if you need support or information about quitting drugs. Alcohol use  Do not drink alcohol if: ? Your health care provider tells you not to drink. ? You are pregnant, may be pregnant, or are planning to become pregnant.  If you drink alcohol: ? Limit how much you use to 0-1 drink a day. ? Limit intake if you are breastfeeding.  Be aware of how much alcohol is in your drink. In the [place] one drink equals one 12 oz bottle of beer (355 mL), one 5 oz glass of wine (148 mL), or one 1 oz glass of hard liquor (44 mL). General instructions  Schedule regular health, dental, and eye exams.  Stay current with your vaccines.  Tell your health care provider if: ? You often feel depressed. ? You have ever been abused or do not feel safe at home. Summary  Adopting a healthy lifestyle and getting preventive care are important in promoting health and wellness.  Follow your health  care provider's instructions about healthy diet, exercising, and getting tested or screened for diseases.  Follow your health care provider's instructions on monitoring your cholesterol and blood pressure. This information is not intended to replace advice given to you by your health care provider. Make sure you discuss any questions you have with your health care provider. Document Released: 12/05/2010 Document Revised: 05/15/2018 Document Reviewed: 05/15/2018 Elsevier Patient Education  2020 [employer].

## 2019-02-20 ENCOUNTER — Other Ambulatory Visit: Payer: Self-pay

## 2019-02-20 DIAGNOSIS — E038 Other specified hypothyroidism: Secondary | ICD-10-CM

## 2019-02-20 LAB — COMPREHENSIVE METABOLIC PANEL
AG Ratio: 1.4 (calc) (ref 1.0–2.5)
ALT: 9 U/L (ref 6–29)
AST: 13 U/L (ref 10–35)
Albumin: 4.1 g/dL (ref 3.6–5.1)
Alkaline phosphatase (APISO): 48 U/L (ref 31–125)
BUN: 10 mg/dL (ref 7–25)
CO2: 27 mmol/L (ref 20–32)
Calcium: 9 mg/dL (ref 8.6–10.2)
Chloride: 106 mmol/L (ref 98–110)
Creat: 0.96 mg/dL (ref 0.50–1.10)
Globulin: 2.9 g/dL (calc) (ref 1.9–3.7)
Glucose, Bld: 95 mg/dL (ref 65–99)
Potassium: 3.8 mmol/L (ref 3.5–5.3)
Sodium: 139 mmol/L (ref 135–146)
Total Bilirubin: 0.4 mg/dL (ref 0.2–1.2)
Total Protein: 7 g/dL (ref 6.1–8.1)

## 2019-02-20 LAB — CBC WITH DIFFERENTIAL/PLATELET
Absolute Monocytes: 485 cells/uL (ref 200–950)
Basophils Absolute: 29 cells/uL (ref 0–200)
Basophils Relative: 0.5 %
Eosinophils Absolute: 120 cells/uL (ref 15–500)
Eosinophils Relative: 2.1 %
HCT: 39.7 % (ref 35.0–45.0)
Hemoglobin: 13.3 g/dL (ref 11.7–15.5)
Lymphs Abs: 2018 cells/uL (ref 850–3900)
MCH: 29.5 pg (ref 27.0–33.0)
MCHC: 33.5 g/dL (ref 32.0–36.0)
MCV: 88 fL (ref 80.0–100.0)
MPV: 11.1 fL (ref 7.5–12.5)
Monocytes Relative: 8.5 %
Neutro Abs: 3050 cells/uL (ref 1500–7800)
Neutrophils Relative %: 53.5 %
Platelets: 265 10*3/uL (ref 140–400)
RBC: 4.51 10*6/uL (ref 3.80–5.10)
RDW: 12.7 % (ref 11.0–15.0)
Total Lymphocyte: 35.4 %
WBC: 5.7 10*3/uL (ref 3.8–10.8)

## 2019-02-20 LAB — T4: T4, Total: 8.8 ug/dL (ref 5.1–11.9)

## 2019-02-20 LAB — TSH: TSH: 0.35 mIU/L — ABNORMAL LOW

## 2019-02-20 MED ORDER — LEVOTHYROXINE SODIUM 112 MCG PO TABS: ORAL_TABLET | ORAL | 1 refills | Status: DC

## 2019-02-20 MED ORDER — LEVOTHYROXINE SODIUM 125 MCG PO TABS: ORAL_TABLET | ORAL | 1 refills | Status: DC

## 2019-02-21 LAB — URINALYSIS, COMPLETE W/RFL CULTURE
Bacteria, UA: NONE SEEN /HPF
Bilirubin Urine: NEGATIVE
Glucose, UA: NEGATIVE
Hgb urine dipstick: NEGATIVE
Hyaline Cast: NONE SEEN /LPF
Ketones, ur: NEGATIVE
Nitrites, Initial: NEGATIVE
Protein, ur: NEGATIVE
RBC / HPF: NONE SEEN /HPF (ref 0–2)
Specific Gravity, Urine: 1.014 (ref 1.001–1.03)
pH: 5.5 (ref 5.0–8.0)

## 2019-02-21 LAB — URINE CULTURE
MICRO NUMBER:: 892607
SPECIMEN QUALITY:: ADEQUATE

## 2019-02-21 LAB — CULTURE INDICATED

## 2019-02-24 ENCOUNTER — Ambulatory Visit: Payer: Commercial Managed Care - PPO

## 2019-04-04 ENCOUNTER — Ambulatory Visit: Payer: Commercial Managed Care - PPO

## 2019-04-08 ENCOUNTER — Ambulatory Visit: Payer: Commercial Managed Care - PPO

## 2019-06-23 ENCOUNTER — Telehealth: Payer: Self-pay | Admitting: *Deleted

## 2019-06-23 NOTE — Telephone Encounter (Signed)
Patient called c/o feeling very tired and fatigue takes 2 doses of synthroid 112 mcg and 125 mcg dose. Patient asked if dose should be adjusted? I explained per result note on 02/19/19 recheck TSH level and fasting lipid profile in 2 months. I explained best to have this done and then medication could be adjusted if needed. Patient said she will have TSH checked at her job and fax results.

## 2019-07-04 ENCOUNTER — Telehealth: Payer: Self-pay | Admitting: Women's Health

## 2019-07-04 ENCOUNTER — Other Ambulatory Visit: Payer: Self-pay | Admitting: Women's Health

## 2019-07-04 MED ORDER — LEVOTHYROXINE SODIUM 112 MCG PO TABS: ORAL_TABLET | ORAL | 0 refills | Status: DC

## 2019-07-04 NOTE — Telephone Encounter (Signed)
Telephone call to review TSH and T4 results.  TSH low at 0.38 and free T4 high at 4.1.  Currently on Synthroid 112 mcg on the weekends and 125 Monday through Friday.  Will change to 112 mcg of the Synthroid daily recheck in 6 weeks.  Reviewed if TSH in free T4 are not back in normal range will follow up with endocrinologist.  Agreeable with plan.

## 2019-09-08 ENCOUNTER — Telehealth: Payer: Self-pay | Admitting: *Deleted

## 2019-09-08 NOTE — Telephone Encounter (Signed)
Please call and ask if TSH was done?  I do have the T3 and T4 which are slightly elevated.  Ask her how she is feeling.

## 2019-09-08 NOTE — Telephone Encounter (Signed)
Patient had thyroid panel done at [facility], currently taking synthroid 112 mcg tablet. Asked if dose change needed? Copy on desk to review.

## 2019-09-09 NOTE — Telephone Encounter (Signed)
Patient is going to check with lab and call me back

## 2019-10-05 ENCOUNTER — Other Ambulatory Visit: Payer: Self-pay | Admitting: Women's Health

## 2019-10-13 ENCOUNTER — Other Ambulatory Visit: Payer: Self-pay | Admitting: Nurse Practitioner

## 2019-10-13 ENCOUNTER — Telehealth: Payer: Self-pay | Admitting: *Deleted

## 2019-10-13 DIAGNOSIS — E039 Hypothyroidism, unspecified: Secondary | ICD-10-CM

## 2019-10-13 MED ORDER — LEVOTHYROXINE SODIUM 112 MCG PO TABS: ORAL_TABLET | ORAL | 1 refills | Status: DC

## 2019-10-13 NOTE — Telephone Encounter (Signed)
Patient called requesting refill on synthroid 112 mcg tablet, patient has labs done at her job and faxes results to our office. See telephone encounter on 09/08/19. Patient said the lab did not drawn TSH, but she feels fine on current dose. Please advise

## 2019-10-15 NOTE — Telephone Encounter (Signed)
Tiffany sent Rx in on 10/13/19.

## 2020-02-23 ENCOUNTER — Encounter: Payer: Commercial Managed Care - PPO | Admitting: Nurse Practitioner

## 2020-02-23 ENCOUNTER — Telehealth: Payer: Self-pay | Admitting: *Deleted

## 2020-02-23 DIAGNOSIS — E039 Hypothyroidism, unspecified: Secondary | ICD-10-CM

## 2020-02-23 MED ORDER — LEVOTHYROXINE SODIUM 112 MCG PO TABS: ORAL_TABLET | ORAL | 0 refills | Status: DC

## 2020-02-23 NOTE — Telephone Encounter (Signed)
Patient called requesting refill on synthroid 112 mcg tablet. Annual exam schedule on 03/15/20

## 2020-03-03 ENCOUNTER — Ambulatory Visit (INDEPENDENT_AMBULATORY_CARE_PROVIDER_SITE_OTHER): Payer: Commercial Managed Care - PPO | Admitting: Nurse Practitioner

## 2020-03-03 ENCOUNTER — Other Ambulatory Visit: Payer: Self-pay

## 2020-03-03 ENCOUNTER — Encounter: Payer: Self-pay | Admitting: Nurse Practitioner

## 2020-03-03 VITALS — BP 120/78 | Ht 64.0 in | Wt 239.0 lb

## 2020-03-03 DIAGNOSIS — E039 Hypothyroidism, unspecified: Secondary | ICD-10-CM

## 2020-03-03 DIAGNOSIS — N939 Abnormal uterine and vaginal bleeding, unspecified: Secondary | ICD-10-CM

## 2020-03-03 DIAGNOSIS — Z8742 Personal history of other diseases of the female genital tract: Secondary | ICD-10-CM

## 2020-03-03 DIAGNOSIS — Z01419 Encounter for gynecological examination (general) (routine) without abnormal findings: Secondary | ICD-10-CM

## 2020-03-03 DIAGNOSIS — D219 Benign neoplasm of connective and other soft tissue, unspecified: Secondary | ICD-10-CM | POA: Diagnosis not present

## 2020-03-03 DIAGNOSIS — Z8049 Family history of malignant neoplasm of other genital organs: Secondary | ICD-10-CM

## 2020-03-03 NOTE — Patient Instructions (Signed)
Health Maintenance, Female Adopting a healthy lifestyle and getting preventive care are important in promoting health and wellness. Ask your health care provider about:  The right schedule for you to have regular tests and exams.  Things you can do on your own to prevent diseases and keep yourself healthy. What should I know about diet, weight, and exercise? Eat a healthy diet   Eat a diet that includes plenty of vegetables, fruits, low-fat dairy products, and lean protein.  Do not eat a lot of foods that are high in solid fats, added sugars, or sodium. Maintain a healthy weight Body mass index (BMI) is used to identify weight problems. It estimates body fat based on height and weight. Your health care provider can help determine your BMI and help you achieve or maintain a healthy weight. Get regular exercise Get regular exercise. This is one of the most important things you can do for your health. Most adults should:  Exercise for at least 150 minutes each week. The exercise should increase your heart rate and make you sweat (moderate-intensity exercise).  Do strengthening exercises at least twice a week. This is in addition to the moderate-intensity exercise.  Spend less time sitting. Even light physical activity can be beneficial. Watch cholesterol and blood lipids Have your blood tested for lipids and cholesterol at 47 years of age, then have this test every 5 years. Have your cholesterol levels checked more often if:  Your lipid or cholesterol levels are high.  You are older than 47 years of age.  You are at high risk for heart disease. What should I know about cancer screening? Depending on your health history and family history, you may need to have cancer screening at various ages. This may include screening for:  Breast cancer.  Cervical cancer.  Colorectal cancer.  Skin cancer.  Lung cancer. What should I know about heart disease, diabetes, and high blood  pressure? Blood pressure and heart disease  High blood pressure causes heart disease and increases the risk of stroke. This is more likely to develop in people who have high blood pressure readings, are of African descent, or are overweight.  Have your blood pressure checked: ? Every 3-5 years if you are 18-39 years of age. ? Every year if you are 40 years old or older. Diabetes Have regular diabetes screenings. This checks your fasting blood sugar level. Have the screening done:  Once every three years after age 40 if you are at a normal weight and have a low risk for diabetes.  More often and at a younger age if you are overweight or have a high risk for diabetes. What should I know about preventing infection? Hepatitis B If you have a higher risk for hepatitis B, you should be screened for this virus. Talk with your health care provider to find out if you are at risk for hepatitis B infection. Hepatitis C Testing is recommended for:  Everyone born from 1945 through 1965.  Anyone with known risk factors for hepatitis C. Sexually transmitted infections (STIs)  Get screened for STIs, including gonorrhea and chlamydia, if: ? You are sexually active and are younger than 47 years of age. ? You are older than 47 years of age and your health care provider tells you that you are at risk for this type of infection. ? Your sexual activity has changed since you were last screened, and you are at increased risk for chlamydia or gonorrhea. Ask your health care provider if   you are at risk.  Ask your health care provider about whether you are at high risk for HIV. Your health care provider may recommend a prescription medicine to help prevent HIV infection. If you choose to take medicine to prevent HIV, you should first get tested for HIV. You should then be tested every 3 months for as long as you are taking the medicine. Pregnancy  If you are about to stop having your period (premenopausal) and  you may become pregnant, seek counseling before you get pregnant.  Take 400 to 800 micrograms (mcg) of folic acid every day if you become pregnant.  Ask for birth control (contraception) if you want to prevent pregnancy. Osteoporosis and menopause Osteoporosis is a disease in which the bones lose minerals and strength with aging. This can result in bone fractures. If you are 65 years old or older, or if you are at risk for osteoporosis and fractures, ask your health care provider if you should:  Be screened for bone loss.  Take a calcium or vitamin D supplement to lower your risk of fractures.  Be given hormone replacement therapy (HRT) to treat symptoms of menopause. Follow these instructions at home: Lifestyle  Do not use any products that contain nicotine or tobacco, such as cigarettes, e-cigarettes, and chewing tobacco. If you need help quitting, ask your health care provider.  Do not use street drugs.  Do not share needles.  Ask your health care provider for help if you need support or information about quitting drugs. Alcohol use  Do not drink alcohol if: ? Your health care provider tells you not to drink. ? You are pregnant, may be pregnant, or are planning to become pregnant.  If you drink alcohol: ? Limit how much you use to 0-1 drink a day. ? Limit intake if you are breastfeeding.  Be aware of how much alcohol is in your drink. In the [place] one drink equals one 12 oz bottle of beer (355 mL), one 5 oz glass of wine (148 mL), or one 1 oz glass of hard liquor (44 mL). General instructions  Schedule regular health, dental, and eye exams.  Stay current with your vaccines.  Tell your health care provider if: ? You often feel depressed. ? You have ever been abused or do not feel safe at home. Summary  Adopting a healthy lifestyle and getting preventive care are important in promoting health and wellness.  Follow your health care provider's instructions about healthy  diet, exercising, and getting tested or screened for diseases.  Follow your health care provider's instructions on monitoring your cholesterol and blood pressure. This information is not intended to replace advice given to you by your health care provider. Make sure you discuss any questions you have with your health care provider. Document Revised: 05/15/2018 Document Reviewed: 05/15/2018 Elsevier Patient Education  2020 Elsevier Inc.  

## 2020-03-03 NOTE — Progress Notes (Addendum)
   Suzanne Beasley 1971-04-21 [id number]   History:  49 y.o. G1P0011 presents for annual exam with complains of spotting and prolonged period bleeding.  The spotting occurs 1 week before her cycle and has been ongoing for about a year.  Her most recent period lasted 8 to 9 days with her normal being around 5 days.  She does have a history of PCOS, fibroids, myomectomy in 2010, and hypothyroidism.  Mother passed away from uterine cancer in her 71s.  Normal Pap and mammogram history.  Gynecologic History Patient's last menstrual period was 02/20/2020. Period Pattern: (!) Irregular Menstrual Flow: Moderate Menstrual Control: Maxi pad, Tampon Dysmenorrhea: (!) Mild Dysmenorrhea Symptoms: Cramping Contraception: none Last Pap: 12/18/2016. Results were: normal Last mammogram: 04/16/2019. Results were: normal  Past medical history, past surgical history, family history and social history were all reviewed and documented in the EPIC chart.  ROS:  A ROS was performed and pertinent positives and negatives are included.  Exam:  Vitals:   03/03/20 0830  BP: 120/78  Weight: 239 lb (108.4 kg)  Height: 5' 4"  (1.626 m)   Body mass index is 41.02 kg/m.  General appearance:  Normal Thyroid:  Symmetrical, normal in size, without palpable masses or nodularity. Respiratory  Auscultation:  Clear without wheezing or rhonchi Cardiovascular  Auscultation:  Regular rate, without rubs, murmurs or gallops  Edema/varicosities:  Not grossly evident Abdominal  Soft,nontender, without masses, guarding or rebound.  Liver/spleen:  No organomegaly noted  Hernia:  None appreciated  Skin  Inspection:  Grossly normal   Breasts: Examined lying and sitting.   Right: Without masses, retractions, discharge or axillary adenopathy.   Left: Without masses, retractions, discharge or axillary adenopathy. Gentitourinary   Inguinal/mons:  Normal without inguinal adenopathy  External genitalia:   Normal  BUS/Urethra/Skene's glands:  Normal  Vagina:  Normal  Cervix:  Normal  Uterus:  Difficult to palpate due to body habitus but no gross masses or tenderness  Adnexa/parametria:     Rt: Without masses or tenderness.   Lt: Without masses or tenderness.  Anus and perineum: Normal   Assessment/Plan:  49 y.o. G1P0011 for annual exam.   Well female exam with routine gynecological exam - Plan: CBC with Differential/Platelet, Comprehensive metabolic panel, Lipid panel. Education provided on SBEs, importance of preventative screenings, current guidelines, high calcium diet, regular exercise, and multivitamin daily.  Congratulated on 15 pound weight loss.  Screening for cervical cancer - normal pap history. Most recent 3 years ago. Discussed current guidelines. We did a pap today per request.   Hypothyroidism, unspecified type - Plan: TSH.  Currently on Synthroid 112 mcg daily.  Says she had TSH checked more recently than 1 year ago but we do not have these records.  History of PCOS - Plan: US PELVIS TRANSVAGINAL NON-OB (TV ONLY)  Fibroids, uterine - Plan: US PELVIS TRANSVAGINAL NON-OB (TV ONLY)  Abnormal uterine bleeding - Plan: US PELVIS TRANSVAGINAL NON-OB (TV ONLY). BTB, prolonged bleeding.   Family history of uterine cancer - Plan: US PELVIS TRANSVAGINAL NON-OB (TV ONLY).  Mother passed away in her 7s from uterine cancer.  Follow-up in 1 year for annual.      Tamela Gammon [facility], 8:45 AM 03/03/2020

## 2020-03-03 NOTE — Addendum Note (Signed)
Addended by: Lorine Bears on: 03/03/2020 09:25 AM   Modules accepted: Orders

## 2020-03-04 LAB — CBC WITH DIFFERENTIAL/PLATELET
Absolute Monocytes: 390 cells/uL (ref 200–950)
Basophils Absolute: 39 cells/uL (ref 0–200)
Basophils Relative: 1 %
Eosinophils Absolute: 90 cells/uL (ref 15–500)
Eosinophils Relative: 2.3 %
HCT: 39.5 % (ref 35.0–45.0)
Hemoglobin: 12.8 g/dL (ref 11.7–15.5)
Lymphs Abs: 1505 cells/uL (ref 850–3900)
MCH: 29.3 pg (ref 27.0–33.0)
MCHC: 32.4 g/dL (ref 32.0–36.0)
MCV: 90.4 fL (ref 80.0–100.0)
MPV: 11.1 fL (ref 7.5–12.5)
Monocytes Relative: 10 %
Neutro Abs: 1876 cells/uL (ref 1500–7800)
Neutrophils Relative %: 48.1 %
Platelets: 240 10*3/uL (ref 140–400)
RBC: 4.37 10*6/uL (ref 3.80–5.10)
RDW: 12.6 % (ref 11.0–15.0)
Total Lymphocyte: 38.6 %
WBC: 3.9 10*3/uL (ref 3.8–10.8)

## 2020-03-04 LAB — LIPID PANEL
Cholesterol: 199 mg/dL (ref <200)
HDL: 56 mg/dL (ref >=50)
LDL Cholesterol (Calc): 125 mg/dL (calc) — ABNORMAL HIGH
Non-HDL Cholesterol (Calc): 143 mg/dL (calc) — ABNORMAL HIGH (ref <130)
Total CHOL/HDL Ratio: 3.6 (calc) (ref <5.0)
Triglycerides: 83 mg/dL (ref <150)

## 2020-03-04 LAB — COMPREHENSIVE METABOLIC PANEL
AG Ratio: 1.5 (calc) (ref 1.0–2.5)
ALT: 10 U/L (ref 6–29)
AST: 12 U/L (ref 10–35)
Albumin: 3.8 g/dL (ref 3.6–5.1)
Alkaline phosphatase (APISO): 42 U/L (ref 31–125)
BUN/Creatinine Ratio: 7 (calc) (ref 6–22)
BUN: 8 mg/dL (ref 7–25)
CO2: 23 mmol/L (ref 20–32)
Calcium: 8.9 mg/dL (ref 8.6–10.2)
Chloride: 107 mmol/L (ref 98–110)
Creat: 1.17 mg/dL — ABNORMAL HIGH (ref 0.50–1.10)
Globulin: 2.6 g/dL (calc) (ref 1.9–3.7)
Glucose, Bld: 98 mg/dL (ref 65–99)
Potassium: 4 mmol/L (ref 3.5–5.3)
Sodium: 138 mmol/L (ref 135–146)
Total Bilirubin: 0.5 mg/dL (ref 0.2–1.2)
Total Protein: 6.4 g/dL (ref 6.1–8.1)

## 2020-03-04 LAB — TSH: TSH: 3.44 mIU/L

## 2020-03-05 ENCOUNTER — Telehealth: Payer: Self-pay | Admitting: *Deleted

## 2020-03-05 LAB — PAP IG W/ RFLX HPV ASCU

## 2020-03-05 NOTE — Telephone Encounter (Signed)
Patient called requesting TSH results from [facility] on 03/03/20. Patient informed with normal results.

## 2020-03-08 ENCOUNTER — Other Ambulatory Visit: Payer: Self-pay | Admitting: Nurse Practitioner

## 2020-03-08 DIAGNOSIS — E039 Hypothyroidism, unspecified: Secondary | ICD-10-CM

## 2020-03-08 MED ORDER — LEVOTHYROXINE SODIUM 112 MCG PO TABS: ORAL_TABLET | ORAL | 2 refills | Status: DC

## 2020-03-10 ENCOUNTER — Ambulatory Visit: Payer: Commercial Managed Care - PPO | Admitting: Nurse Practitioner

## 2020-03-10 ENCOUNTER — Other Ambulatory Visit: Payer: Self-pay

## 2020-03-10 ENCOUNTER — Encounter: Payer: Self-pay | Admitting: Nurse Practitioner

## 2020-03-10 VITALS — BP 126/80

## 2020-03-10 DIAGNOSIS — L723 Sebaceous cyst: Secondary | ICD-10-CM

## 2020-03-10 NOTE — Progress Notes (Signed)
   Acute Office Visit  Subjective:    Patient ID: Suzanne Beasley, female    DOB: [birth year], 49 y.o.   MRN: [id number]   HPI 49 y.o. presents today for boil on rectum that she noticed 2 days ago. It is tender with sitting/touching, denies redness, warmth, drainage, or fever. She has had these in the past and they typically resolved on their own. Her most recent one was 4-5 months ago.    Review of Systems  Constitutional: Negative.   Gastrointestinal: Positive for rectal pain.  Skin:       Boil-like area on rectum       Objective:    Physical Exam Constitutional:      Appearance: Normal appearance.  Genitourinary:      BP 126/80 (BP Location: Right Arm, Patient Position: Sitting, Cuff Size: Large)   LMP 02/20/2020  Wt Readings from Last 3 Encounters:  03/03/20 239 lb (108.4 kg)  02/19/19 254 lb (115.2 kg)  02/06/18 243 lb (110.2 kg)        Assessment & Plan:   Problem List Items Addressed This Visit    None    Visit Diagnoses    Sebaceous cyst    -  Primary      Plan: Reassurance provided on non-infectious state and most likely a sebaceous cyst. We have agreed to watch at this time. Recommend warm compresses/baths, avoiding popping or trying to drain by self due to risk for infection, and avoid friction. She will return if there is an increase in redness, drainage, swelling, or warmth. She is agreeable to plan.     Tamela Gammon [facility], 3:46 PM 03/10/2020

## 2020-03-15 ENCOUNTER — Encounter: Payer: Commercial Managed Care - PPO | Admitting: Nurse Practitioner

## 2020-03-25 ENCOUNTER — Encounter: Payer: Self-pay | Admitting: Nurse Practitioner

## 2020-03-25 ENCOUNTER — Other Ambulatory Visit: Payer: Self-pay

## 2020-03-25 ENCOUNTER — Ambulatory Visit: Payer: Commercial Managed Care - PPO | Admitting: Nurse Practitioner

## 2020-03-25 ENCOUNTER — Ambulatory Visit (INDEPENDENT_AMBULATORY_CARE_PROVIDER_SITE_OTHER): Payer: Commercial Managed Care - PPO

## 2020-03-25 VITALS — BP 120/82

## 2020-03-25 DIAGNOSIS — Z8049 Family history of malignant neoplasm of other genital organs: Secondary | ICD-10-CM

## 2020-03-25 DIAGNOSIS — D251 Intramural leiomyoma of uterus: Secondary | ICD-10-CM

## 2020-03-25 DIAGNOSIS — D259 Leiomyoma of uterus, unspecified: Secondary | ICD-10-CM

## 2020-03-25 DIAGNOSIS — Z8742 Personal history of other diseases of the female genital tract: Secondary | ICD-10-CM

## 2020-03-25 DIAGNOSIS — N921 Excessive and frequent menstruation with irregular cycle: Secondary | ICD-10-CM | POA: Diagnosis not present

## 2020-03-25 DIAGNOSIS — D252 Subserosal leiomyoma of uterus: Secondary | ICD-10-CM

## 2020-03-25 DIAGNOSIS — D219 Benign neoplasm of connective and other soft tissue, unspecified: Secondary | ICD-10-CM

## 2020-03-25 DIAGNOSIS — N939 Abnormal uterine and vaginal bleeding, unspecified: Secondary | ICD-10-CM | POA: Diagnosis not present

## 2020-03-25 DIAGNOSIS — N854 Malposition of uterus: Secondary | ICD-10-CM

## 2020-03-25 NOTE — Patient Instructions (Signed)

## 2020-03-25 NOTE — Progress Notes (Signed)
History: 49 year old [id number] presents for ultrasound follow up. Was seen by me 03/03/2020 with complaints of spotting and prolonged menstrual bleeding. The spotting occurs 1 week before her cycle and has been ongoing for about a year.  Her menses in September lasted 8 to 9 days with her normal being around 5 days. She reports no BTB and a regular cycle last week. History of PCOS, fibroids, myomectomy in 2010, and hypothyroidism.  TSH at that visit was normal.  Mother passed away from uterine cancer in her 76s.  Normal Pap history.  Exam: Appears well Ultrasound: Retroverted uterus, normal size and shape. Fibroids - intramural 14 mm located left uterus, subserosal 13 mm located fundal.  Symmetrical endometrium, no masses or thickening seen - 7.5 mm.  Right ovary with normal volume, peripheral follicles noted.  Left ovary with dominant follicle, other follicles noted.  No adnexal masses.  Mild, clear free fluid, bilateral adnexa.  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  Small fibroids most likely not cause for BTB or her heavy cycle last month. She will monitor her cycles and follow up as needed. She is agreeable to plan.

## 2020-08-03 ENCOUNTER — Telehealth: Payer: Self-pay | Admitting: *Deleted

## 2020-08-03 ENCOUNTER — Other Ambulatory Visit: Payer: Self-pay | Admitting: Nurse Practitioner

## 2020-08-03 DIAGNOSIS — E039 Hypothyroidism, unspecified: Secondary | ICD-10-CM

## 2020-08-03 NOTE — Telephone Encounter (Signed)
-----   Message from Sinclair Grooms sent at 08/03/2020  3:16 PM EST ----- Regarding: nurse call Patient left message that she wanted her TSH checked but I do not see a lab order or where TW mention it. thx

## 2020-08-03 NOTE — Telephone Encounter (Signed)
Left message for patient to call.

## 2020-08-03 NOTE — Telephone Encounter (Signed)
Yes that is fine.  Thank you.

## 2020-08-03 NOTE — Telephone Encounter (Signed)
Patient called lost 40 pounds since last visit, she asked if TSH level can be recheck just to make sure her thyroid level is normal since the weight lost? If okay I will scheduled patient to lab appointment on 08/05/20 @ 11:00am

## 2020-08-04 NOTE — Telephone Encounter (Signed)
Lab order placed, lab appointment scheduled.

## 2020-08-05 ENCOUNTER — Other Ambulatory Visit: Payer: Self-pay

## 2020-08-05 ENCOUNTER — Other Ambulatory Visit: Payer: Commercial Managed Care - PPO

## 2020-08-05 DIAGNOSIS — E039 Hypothyroidism, unspecified: Secondary | ICD-10-CM

## 2020-08-06 ENCOUNTER — Other Ambulatory Visit: Payer: Self-pay | Admitting: Nurse Practitioner

## 2020-08-06 DIAGNOSIS — E039 Hypothyroidism, unspecified: Secondary | ICD-10-CM

## 2020-08-06 LAB — TSH: TSH: 15.43 mIU/L — ABNORMAL HIGH

## 2020-08-06 MED ORDER — LEVOTHYROXINE SODIUM 125 MCG PO TABS: 125.0000 ug | ORAL_TABLET | Freq: Every day | ORAL | 0 refills | Status: DC

## 2020-08-23 ENCOUNTER — Other Ambulatory Visit: Payer: Self-pay | Admitting: Nurse Practitioner

## 2020-08-23 DIAGNOSIS — Z1231 Encounter for screening mammogram for malignant neoplasm of breast: Secondary | ICD-10-CM

## 2020-09-03 ENCOUNTER — Telehealth: Payer: Self-pay | Admitting: *Deleted

## 2020-09-03 DIAGNOSIS — E039 Hypothyroidism, unspecified: Secondary | ICD-10-CM

## 2020-09-03 MED ORDER — LEVOTHYROXINE SODIUM 125 MCG PO TABS: 125.0000 ug | ORAL_TABLET | Freq: Every day | ORAL | 0 refills | Status: DC

## 2020-09-03 NOTE — Telephone Encounter (Signed)
Patient called requesting refill on synthroid 125 mcg tablet, I asked patient did she pick up 60 tablet on 08/06/20. Patient said the pharmacy only gave her 30 tablets that day. Tiffany wanted patient to return in 6 week after taking the 125 mcg dose and have TSH level rechecked. I explained this to patient and approved another Rx to allow for the 6 week period. Patient verbalized she will repeat TSH level at 6 week mark.

## 2020-09-15 ENCOUNTER — Other Ambulatory Visit: Payer: Commercial Managed Care - PPO

## 2020-09-15 ENCOUNTER — Other Ambulatory Visit: Payer: Self-pay

## 2020-09-15 DIAGNOSIS — E039 Hypothyroidism, unspecified: Secondary | ICD-10-CM

## 2020-09-15 LAB — TSH: TSH: 0.78 mIU/L

## 2020-09-16 ENCOUNTER — Other Ambulatory Visit: Payer: Self-pay | Admitting: Nurse Practitioner

## 2020-09-16 DIAGNOSIS — E039 Hypothyroidism, unspecified: Secondary | ICD-10-CM

## 2020-09-16 MED ORDER — LEVOTHYROXINE SODIUM 125 MCG PO TABS
125.0000 ug | ORAL_TABLET | Freq: Every day | ORAL | 0 refills | Status: DC
Start: 2020-09-16 — End: 2020-10-20

## 2020-10-18 ENCOUNTER — Other Ambulatory Visit: Payer: Self-pay

## 2020-10-18 ENCOUNTER — Ambulatory Visit
Admission: RE | Admit: 2020-10-18 | Discharge: 2020-10-18 | Disposition: A | Discharge status: Home / Self Care | Payer: Commercial Managed Care - PPO | Source: Ambulatory Visit | Attending: Nurse Practitioner | Admitting: Nurse Practitioner

## 2020-10-18 DIAGNOSIS — Z1231 Encounter for screening mammogram for malignant neoplasm of breast: Secondary | ICD-10-CM

## 2020-10-20 ENCOUNTER — Encounter: Payer: Self-pay | Admitting: Family Medicine

## 2020-10-20 ENCOUNTER — Ambulatory Visit (INDEPENDENT_AMBULATORY_CARE_PROVIDER_SITE_OTHER): Payer: Commercial Managed Care - PPO | Admitting: Family Medicine

## 2020-10-20 ENCOUNTER — Other Ambulatory Visit: Payer: Self-pay

## 2020-10-20 VITALS — BP 126/79 | HR 74 | Temp 98.5°F | Ht 63.5 in | Wt 200.0 lb

## 2020-10-20 DIAGNOSIS — Z7689 Persons encountering health services in other specified circumstances: Secondary | ICD-10-CM | POA: Diagnosis not present

## 2020-10-20 DIAGNOSIS — E063 Autoimmune thyroiditis: Secondary | ICD-10-CM

## 2020-10-20 DIAGNOSIS — E038 Other specified hypothyroidism: Secondary | ICD-10-CM | POA: Diagnosis not present

## 2020-10-20 DIAGNOSIS — E039 Hypothyroidism, unspecified: Secondary | ICD-10-CM

## 2020-10-20 DIAGNOSIS — Z1211 Encounter for screening for malignant neoplasm of colon: Secondary | ICD-10-CM | POA: Insufficient documentation

## 2020-10-20 LAB — TSH: TSH: 0.36 u[IU]/mL (ref 0.35–4.50)

## 2020-10-20 LAB — T4, FREE: Free T4: 1.15 ng/dL (ref 0.60–1.60)

## 2020-10-20 MED ORDER — LEVOTHYROXINE SODIUM 125 MCG PO TABS: 125.0000 ug | ORAL_TABLET | Freq: Every day | ORAL | 6 refills | Status: DC

## 2020-10-20 NOTE — Progress Notes (Signed)
Patient ID: Suzanne Beasley, female  DOB: 12-28-1970, 50 y.o.   MRN: [id number] Patient Care Team    Relationship Specialty Notifications Start End  Suzanne Hillock, DO PCP - General Family Medicine  10/20/20   Suzanne Gammon, NP Nurse Practitioner Gynecology  10/21/20     Chief Complaint  Patient presents with  . Establish Care  . Hypothyroidism    Subjective:  Suzanne Beasley is a 50 y.o.  female present for new patient establishment. All past medical history, surgical history, allergies, family history, immunizations, medications and social history were updated in the electronic medical record today. All recent labs, ED visits and hospitalizations within the last year were reviewed.  Hypothyroid: Patient reports she has had a thyroid disorder for many years.  She has occasional times where her doses need to be altered slightly usually with weight loss/or weight gain.  She has been recently increased to Synthroid 125 mcg daily.  She reports compliance daily with this medication on an empty stomach.  She did have labs collected approximately 4 weeks ago which showed TSH in normal range.  Provider recommended recheck in additional 8 weeks.  Patient is establishing here today and would like to be followed for her thyroid.  Colon cancer screening: Patient reports she has never had a colonoscopy.  There is no family history of colon cancer.  She would like to proceed with colon cancer screening.  Depression screen [facility] 2/9 10/21/2020  Decreased Interest 0  Down, Depressed, Hopeless 0  PHQ - 2 Score 0   No flowsheet data found.      No flowsheet data found.   Immunization History  Administered Date(s) Administered  . PFIZER(Purple Top)SARS-COV-2 Vaccination 04/07/2020, 08/12/2020, 09/07/2020  . Tdap 06/17/2015    No exam data present  Past Medical History:  Diagnosis Date  . Fibroids    With myomectomy  . Heartburn   . Hyperlipidemia    diet contolled - no meds  .  Hypothyroidism   . Lumbar herniated disc 02/08/2011  . PCOS (polycystic ovarian syndrome)    Allergies  Allergen Reactions  . Shellfish Allergy Nausea Only  . Latex Itching   Past Surgical History:  Procedure Laterality Date  . DILATION AND EVACUATION N/A 10/08/2014   Procedure: DILATATION AND EVACUATION;  Surgeon: Linda Hedges, DO;  Location: [facility];  Service: Gynecology;  Laterality: N/A;  . MYOMECTOMY  2011   abd   Family History  Problem Relation Age of Onset  . Hypertension Mother   . Uterine cancer Mother   . Early death Mother   . Miscarriages / [place] Mother   . Diabetes Father   . Hypertension Father   . Heart disease Father   . Prostate cancer Father   . Aneurysm Father   . Breast cancer Maternal Aunt   . Asthma Son   . Arthritis Maternal Grandmother   . Diabetes Maternal Grandmother   . Hypertension Maternal Grandmother   . Diabetes Paternal Grandmother   . Alzheimer's disease Paternal Grandmother    Social History   Social History Narrative   Marital status/children/pets: Married   Education/employment: Gaffer.  Works in Programmer, applications at [employer]:      -smoke alarm in the home:Yes     - wears seatbelt: Yes     - Feels safe in their relationships: Yes    Allergies as of 10/20/2020      Reactions   Shellfish Allergy Nausea  Only   Latex Itching      Medication List       Accurate as of Oct 20, 2020 11:59 PM. If you have any questions, ask your nurse or doctor.        levothyroxine 125 MCG tablet Commonly known as: Synthroid Take 1 tablet (125 mcg total) by mouth daily before breakfast. Needs TSH level rechecked at 6 weeks   multivitamin tablet Take 1 tablet by mouth daily.       All past medical history, surgical history, allergies, family history, immunizations andmedications were updated in the EMR today and reviewed under the history and medication portions of their EMR.     MM 3D SCREEN BREAST BILATERAL  Result Date:  10/18/2020 CLINICAL DATA:  Screening. EXAM: DIGITAL SCREENING BILATERAL MAMMOGRAM WITH TOMOSYNTHESIS AND CAD TECHNIQUE: Bilateral screening digital craniocaudal and mediolateral oblique mammograms were obtained. Bilateral screening digital breast tomosynthesis was performed. The images were evaluated with computer-aided detection. COMPARISON:  Previous exam(s). ACR Breast Density Category b: There are scattered areas of fibroglandular density. FINDINGS: There are no findings suspicious for malignancy. The images were evaluated with computer-aided detection. IMPRESSION: No mammographic evidence of malignancy. A result letter of this screening mammogram will be mailed directly to the patient. RECOMMENDATION: Screening mammogram in one year. (Code:SM-B-01Y) BI-RADS CATEGORY  1: Negative. Electronically Signed   By: Kristopher Oppenheim M.D.   On: 10/18/2020 15:11     ROS: 14 pt review of systems performed and negative (unless mentioned in an HPI)  Objective: BP 126/79   Pulse 74   Temp 98.5 F (36.9 C) (Oral)   Ht 5' 3.5" (1.613 m)   Wt 200 lb (90.7 kg)   LMP 10/11/2020 (Exact Date)   SpO2 99%   BMI 34.87 kg/m  Gen: Afebrile. No acute distress. Nontoxic in appearance, well-developed, well-nourished, very pleasant female HENT: AT. [place].  No cough.  No hoarseness. Eyes:Pupils Equal Round Reactive to light, Extraocular movements intact,  Conjunctiva without redness, discharge or icterus. Neck/lymp/endocrine: Supple, no lymphadenopathy, no thyromegaly CV: RRR no murmur, no edema, +2/4 P posterior tibialis pulses.  Chest: CTAB, no wheeze, rhonchi or crackles.  Normal respiratory effort.  Good air movement. Neuro/Msk:  Normal gait. PERLA. EOMi. Alert. Oriented x3.  Psych: Normal affect, dress and demeanor. Normal speech. Normal thought content and judgment.   Assessment/plan: Suzanne Beasley is a 50 y.o. female present for establishment of care/[facility] Hypothyroidism due to Hashimoto's thyroiditis Recent  dosage change.  We will recheck labs today. Continue levothyroxine 125 mcg daily on an empty stomach. - TSH - T4, free  follow-ups at physicals unless symptoms occur  Colon cancer screening Discussed colon cancer screenings.  She is at low risk for colon cancer.  She was given the option of GI referral for colonoscopy versus Cologuard.  Patient would like to proceed with Cologuard today. - Cologuard   Return in about 5 months (around 03/07/2021) for CPE (30 min).  Orders Placed This Encounter  Procedures  . Cologuard  . TSH  . T4, free   Meds ordered this encounter  Medications  . levothyroxine (SYNTHROID) 125 MCG tablet    Sig: Take 1 tablet (125 mcg total) by mouth daily before breakfast. Needs TSH level rechecked at 6 weeks    Dispense:  30 tablet    Refill:  6   Referral Orders  No referral(s) requested today     Note is dictated utilizing voice recognition software. Although note has been proof read prior  to signing, occasional typographical errors still can be missed. If any questions arise, please do not hesitate to call for verification.  Electronically signed by: Howard Pouch, DO [place]

## 2020-10-20 NOTE — Patient Instructions (Addendum)
   Great to see you today.  I have refilled the medication(s) we provide.   If labs were collected, we will inform you of lab results once received either by echart message or telephone call.   - echart message- for normal results that have been seen by the patient already.   - telephone call: abnormal results or if patient has not viewed results in their echart.

## 2020-10-21 ENCOUNTER — Encounter: Payer: Self-pay | Admitting: Family Medicine

## 2020-11-04 LAB — COLOGUARD: Cologuard: NEGATIVE

## 2020-11-11 LAB — COLOGUARD: COLOGUARD: NEGATIVE

## 2020-12-29 ENCOUNTER — Encounter: Payer: Self-pay | Admitting: Family Medicine

## 2020-12-29 ENCOUNTER — Ambulatory Visit: Payer: Commercial Managed Care - PPO | Admitting: Family Medicine

## 2020-12-29 ENCOUNTER — Other Ambulatory Visit: Payer: Self-pay

## 2020-12-29 VITALS — BP 122/75 | HR 77 | Temp 98.3°F | Ht 64.0 in | Wt 200.0 lb

## 2020-12-29 DIAGNOSIS — R002 Palpitations: Secondary | ICD-10-CM | POA: Diagnosis not present

## 2020-12-29 DIAGNOSIS — E079 Disorder of thyroid, unspecified: Secondary | ICD-10-CM

## 2020-12-29 DIAGNOSIS — F410 Panic disorder [episodic paroxysmal anxiety] without agoraphobia: Secondary | ICD-10-CM

## 2020-12-29 LAB — TSH: TSH: 0.51 u[IU]/mL (ref 0.35–5.50)

## 2020-12-29 LAB — CBC WITH DIFFERENTIAL/PLATELET
Basophils Absolute: 0 10*3/uL (ref 0.0–0.1)
Basophils Relative: 0.8 % (ref 0.0–3.0)
Eosinophils Absolute: 0.1 10*3/uL (ref 0.0–0.7)
Eosinophils Relative: 1.3 % (ref 0.0–5.0)
HCT: 40.3 % (ref 36.0–46.0)
Hemoglobin: 13.7 g/dL (ref 12.0–15.0)
Lymphocytes Relative: 36.1 % (ref 12.0–46.0)
Lymphs Abs: 1.5 10*3/uL (ref 0.7–4.0)
MCHC: 33.9 g/dL (ref 30.0–36.0)
MCV: 88.7 fl (ref 78.0–100.0)
Monocytes Absolute: 0.5 10*3/uL (ref 0.1–1.0)
Monocytes Relative: 10.7 % (ref 3.0–12.0)
Neutro Abs: 2.1 10*3/uL (ref 1.4–7.7)
Neutrophils Relative %: 51.1 % (ref 43.0–77.0)
Platelets: 216 10*3/uL (ref 150.0–400.0)
RBC: 4.55 Mil/uL (ref 3.87–5.11)
RDW: 13.3 % (ref 11.5–15.5)
WBC: 4.2 10*3/uL (ref 4.0–10.5)

## 2020-12-29 LAB — COMPREHENSIVE METABOLIC PANEL
ALT: 11 U/L (ref 0–35)
AST: 13 U/L (ref 0–37)
Albumin: 3.8 g/dL (ref 3.5–5.2)
Alkaline Phosphatase: 43 U/L (ref 39–117)
BUN: 19 mg/dL (ref 6–23)
CO2: 24 mEq/L (ref 19–32)
Calcium: 9 mg/dL (ref 8.4–10.5)
Chloride: 104 mEq/L (ref 96–112)
Creatinine, Ser: 1.14 mg/dL (ref 0.40–1.20)
GFR: 57.24 mL/min — ABNORMAL LOW (ref >60.00)
Glucose, Bld: 78 mg/dL (ref 70–99)
Potassium: 4.4 mEq/L (ref 3.5–5.1)
Sodium: 135 mEq/L (ref 135–145)
Total Bilirubin: 0.6 mg/dL (ref 0.2–1.2)
Total Protein: 6.8 g/dL (ref 6.0–8.3)

## 2020-12-29 LAB — T4, FREE: Free T4: 1.24 ng/dL (ref 0.60–1.60)

## 2020-12-29 MED ORDER — HYDROXYZINE HCL 10 MG PO TABS: 10.0000 mg | ORAL_TABLET | Freq: Three times a day (TID) | ORAL | 1 refills | Status: DC | PRN

## 2020-12-29 NOTE — Patient Instructions (Signed)
Palpitations Palpitations are feelings that your heartbeat is not normal. Your heartbeat may feel like it is: Uneven. Faster than normal. Fluttering. Skipping a beat. This is usually not a serious problem. In some cases, you may need tests torule out any serious problems. Follow these instructions at home: Pay attention to any changes in your condition. Take these actions to helpmanage your symptoms: Eating and drinking Avoid: Coffee, tea, soft drinks, and energy drinks. Chocolate. Alcohol. Diet pills. Lifestyle  Try to lower your stress. These things can help you relax: Yoga. Deep breathing and meditation. Exercise. Using words and images to create positive thoughts (guided imagery). Using your mind to control things in your body (biofeedback). Do not use drugs. Get plenty of rest and sleep. Keep a regular bed time.  General instructions  Take over-the-counter and prescription medicines only as told by your doctor. Do not use any products that contain nicotine or tobacco, such as cigarettes and e-cigarettes. If you need help quitting, ask your doctor. Keep all follow-up visits as told by your doctor. This is important. You may need more tests if palpitations do not go away or get worse.  Contact a doctor if: Your symptoms last more than 24 hours. Your symptoms occur more often. Get help right away if you: Have chest pain. Feel short of breath. Have a very bad headache. Feel dizzy. Pass out (faint). Summary Palpitations are feelings that your heartbeat is uneven or faster than normal. It may feel like your heart is fluttering or skipping a beat. Avoid food and drinks that may cause palpitations. These include caffeine, chocolate, and alcohol. Try to lower your stress. Do not smoke or use drugs. Get help right away if you faint or have chest pain, shortness of breath, a severe headache, or dizziness. This information is not intended to replace advice given to you by your  health care provider. Make sure you discuss any questions you have with your healthcare provider. Document Revised: 07/04/2017 Document Reviewed: 07/04/2017 Elsevier Patient Education  2022 [employer].

## 2020-12-29 NOTE — Progress Notes (Signed)
This visit occurred during the SARS-CoV-2 public health emergency.  Safety protocols were in place, including screening questions prior to the visit, additional usage of staff PPE, and extensive cleaning of exam room while observing appropriate contact time as indicated for disinfecting solutions.    Suzanne Beasley , 1971-02-15, 50 y.o., female MRN: [id number] Patient Care Team    Relationship Specialty Notifications Start End  Ma Hillock, DO PCP - General Family Medicine  10/20/20   Tamela Gammon, NP Nurse Practitioner Gynecology  10/21/20     Chief Complaint  Patient presents with   Palpitations    Pt c/o palpations and SOB worsening in the last week causing her to panic; pt states she was resting from an exercising once but the other time she was resting(driving)     Subjective: Suzanne Beasley is a 50 y.o. Pt presents for an OV with complaints of palpitations. She reports she had palpitations off/on in the past- but they are "short". However, last week it lasted longer and made her anxious. She was exercising at that time and she states the palpitations last about 5-8 minutes. Sunday occurred again while she was driving, but only last a few seconds.  She reports she did panic and get very nervous when it was more than a second and is wondering if that is why it last longer when she was working out.  She drinks coffee daily, but usually after her work out she will have a cup. She denies any herbals/OTC?pre work out drinks etc or change in meds. She reports she had palpitations in the past and wore a monitor for a week, it was normal.  She has a thyroid condition and had an abnormal level last fall, it has been normal since. Pt reports she is compliant with med daily on an empty stomach  She denies any dizziness or chest pain with her palpitations. She did feel winded for a moment, but she thinks it was bc panic set in.    Depression screen PHQ 2/9 10/21/2020  Decreased  Interest 0  Down, Depressed, Hopeless 0  PHQ - 2 Score 0    Allergies  Allergen Reactions   Shellfish Allergy Nausea Only   Latex Itching   Social History   Social History Narrative   Marital status/children/pets: Married   Education/employment: College degree.  Works in Programmer, applications at [employer]:      -smoke alarm in the home:Yes     - wears seatbelt: Yes     - Feels safe in their relationships: Yes   Past Medical History:  Diagnosis Date   Fibroids    With myomectomy   Heartburn    Hyperlipidemia    diet contolled - no meds   Hypothyroidism    Lumbar herniated disc 02/08/2011   PCOS (polycystic ovarian syndrome)    Past Surgical History:  Procedure Laterality Date   DILATION AND EVACUATION N/A 10/08/2014   Procedure: DILATATION AND EVACUATION;  Surgeon: Linda Hedges, DO;  Location: [facility];  Service: Gynecology;  Laterality: N/A;   MYOMECTOMY  2011   abd   Family History  Problem Relation Age of Onset   Hypertension Mother    Uterine cancer Mother    Early death Mother    Miscarriages / Stillbirths Mother    Diabetes Father    Hypertension Father    Heart disease Father    Prostate cancer Father    Aneurysm Father  Breast cancer Maternal Aunt    Asthma Son    Arthritis Maternal Grandmother    Diabetes Maternal Grandmother    Hypertension Maternal Grandmother    Diabetes Paternal Grandmother    Alzheimer's disease Paternal Grandmother    Allergies as of 12/29/2020       Reactions   Shellfish Allergy Nausea Only   Latex Itching        Medication List        Accurate as of December 29, 2020  9:33 AM. If you have any questions, ask your nurse or doctor.          hydrOXYzine 10 MG tablet Commonly known as: ATARAX/VISTARIL Take 1 tablet (10 mg total) by mouth 3 (three) times daily as needed. Started by: Howard Pouch, DO   levothyroxine 125 MCG tablet Commonly known as: Synthroid Take 1 tablet (125 mcg total) by mouth daily before  breakfast. Needs TSH level rechecked at 6 weeks   multivitamin tablet Take 1 tablet by mouth daily.        All past medical history, surgical history, allergies, family history, immunizations andmedications were updated in the EMR today and reviewed under the history and medication portions of their EMR.     ROS: Negative, with the exception of above mentioned in HPI   Objective:  BP 122/75   Pulse 77   Temp 98.3 F (36.8 C) (Oral)   Ht 5' 4" (1.626 m)   Wt 200 lb (90.7 kg)   SpO2 100%   BMI 34.33 kg/m  Body mass index is 34.33 kg/m. Gen: Afebrile. No acute distress. Nontoxic in appearance, well developed, well nourished. Very pleasant female.  HENT: AT. [place]. Eyes:Pupils Equal Round Reactive to light, Extraocular movements intact,  Conjunctiva without redness, discharge or icterus. Neck/lymp/endocrine: Supple,no lymphadenopathy, no thyromegaly CV: RRR no murmur, no edema Chest: CTAB, no wheeze or crackles. Good air movement, normal resp effort.  Skin: no rashes, purpura or petechiae.  Neuro: Normal gait. PERLA. EOMi. Alert. Oriented x3  Psych: Normal affect, dress and demeanor. Normal speech. Normal thought content and judgment.  No results found. No results found. No results found for this or any previous visit (from the past 24 hour(s)).  Assessment/Plan: Suzanne Beasley is a 50 y.o. female present for OV for  Palpitations/Panic attack/Thyroid disease R/o thyroid, anemia, electrolytes disturbance or iron def as cause.  Discussed palpitations and emergent precaution.  Discussed med for panic- she would like to have something to help if needed. Vistaril 10 mg prn prescribed.  Avoid caffeine and other stimulants.  - T4, free - TSH - Comp Met (CMET) - CBC w/Diff - Iron, TIBC and Ferritin Panel F/u dependent upon lab results. Consider cardio referral if appropriate   Reviewed expectations re: course of current medical issues. Discussed self-management of  symptoms. Outlined signs and symptoms indicating need for more acute intervention. Patient verbalized understanding and all questions were answered. Patient received an After-Visit Summary.    Orders Placed This Encounter  Procedures   Cologuard   T4, free   TSH   Comp Met (CMET)   CBC w/Diff   Iron, TIBC and Ferritin Panel   Meds ordered this encounter  Medications   hydrOXYzine (ATARAX/VISTARIL) 10 MG tablet    Sig: Take 1 tablet (10 mg total) by mouth 3 (three) times daily as needed.    Dispense:  30 tablet    Refill:  1   Referral Orders  No referral(s) requested today  Note is dictated utilizing voice recognition software. Although note has been proof read prior to signing, occasional typographical errors still can be missed. If any questions arise, please do not hesitate to call for verification.   electronically signed by:  Howard Pouch, DO  [place]

## 2020-12-30 ENCOUNTER — Telehealth: Payer: Self-pay | Admitting: Family Medicine

## 2020-12-30 DIAGNOSIS — R002 Palpitations: Secondary | ICD-10-CM

## 2020-12-30 LAB — IRON,TIBC AND FERRITIN PANEL
%SAT: 34 % (calc) (ref 16–45)
Ferritin: 53 ng/mL (ref 16–232)
Iron: 108 ug/dL (ref 40–190)
TIBC: 314 mcg/dL (calc) (ref 250–450)

## 2020-12-30 NOTE — Telephone Encounter (Signed)
Spoke with pt regarding labs and instructions.

## 2020-12-30 NOTE — Telephone Encounter (Signed)
Please call patient Liver and thyroid function are normal Iron levels are normal. Blood cell counts and electrolytes are normal-sodium levels are at the very low end of normal.  This is okay, but I would encourage her to drink at least 1 electrolyte replacement drink daily.  Her kidney function is stable from prior collections.  She does have mild decrease in filtration with a GFR of 57.24.  Her BUN is 19, and in the past it had been usually around 10.  I would caution her on heavy creatine or protein supplements/shakes.  These can affect the kidneys negatively.   -I do not want her to go over concerned about this, BUN of 19 is okay , it is just a change from her normal and is getting in mildly dehydrated range. I recommend she ensure taking in adequate hydration  of at least 80-90 ounces of water/electrolyte replacement daily exercising.  Adequate hydration not only is good for the body overall, but helps keep kidneys healthy.  Increasing the water/electrolytes and decreasing the protein shakes/supplements her BUN will return back to her normal baseline.  Since her labs do not really give [place] a cause for her complaints, referred her back to cardiology to be safe.

## 2021-01-31 DIAGNOSIS — R12 Heartburn: Secondary | ICD-10-CM | POA: Insufficient documentation

## 2021-01-31 DIAGNOSIS — E785 Hyperlipidemia, unspecified: Secondary | ICD-10-CM | POA: Insufficient documentation

## 2021-02-09 ENCOUNTER — Other Ambulatory Visit: Payer: Self-pay

## 2021-02-09 ENCOUNTER — Encounter: Payer: Self-pay | Admitting: Cardiology

## 2021-02-09 ENCOUNTER — Ambulatory Visit: Payer: Commercial Managed Care - PPO | Admitting: Cardiology

## 2021-02-09 ENCOUNTER — Ambulatory Visit (INDEPENDENT_AMBULATORY_CARE_PROVIDER_SITE_OTHER): Payer: Commercial Managed Care - PPO

## 2021-02-09 VITALS — BP 136/84 | HR 80 | Ht 63.0 in | Wt 209.0 lb

## 2021-02-09 DIAGNOSIS — R002 Palpitations: Secondary | ICD-10-CM | POA: Diagnosis not present

## 2021-02-09 DIAGNOSIS — R0602 Shortness of breath: Secondary | ICD-10-CM | POA: Insufficient documentation

## 2021-02-09 DIAGNOSIS — E669 Obesity, unspecified: Secondary | ICD-10-CM | POA: Diagnosis not present

## 2021-02-09 NOTE — Patient Instructions (Signed)
Medication Instructions:  Your physician recommends that you continue on your current medications as directed. Please refer to the Current Medication list given to you today.  *If you need a refill on your cardiac medications before your next appointment, please call your pharmacy*   Lab Work: None If you have labs (blood work) drawn today and your tests are completely normal, you will receive your results only by: [place] (if you have MyChart) OR A paper copy in the mail If you have any lab test that is abnormal or we need to change your treatment, we will call you to review the results.   Testing/Procedures: Your physician has requested that you have an echocardiogram. Echocardiography is a painless test that uses sound waves to create images of your heart. It provides your doctor with information about the size and shape of your heart and how well your heart's chambers and valves are working. This procedure takes approximately one hour. There are no restrictions for this procedure.  A zio monitor was ordered today. It will remain on for 14 days. You will then return monitor and event diary in provided box. It takes 1-2 weeks for report to be downloaded and returned to [place]. We will call you with the results. If monitor falls off or has orange flashing light, please call Zio for further instructions.   ZIO  WHY IS MY DOCTOR PRESCRIBING ZIO? The Zio system is proven and trusted by physicians to detect and diagnose irregular heart rhythms -- and has been prescribed to hundreds of thousands of patients.  The FDA has cleared the Zio system to monitor for many different kinds of irregular heart rhythms. In a study, physicians were able to reach a diagnosis 90% of the time with the Zio system1.  You can wear the Zio monitor -- a small, discreet, comfortable patch -- during your normal day-to-day activity, including while you sleep, shower, and exercise, while it records every single  heartbeat for analysis.  1Barrett, P., et al. Comparison of 24 Hour Holter Monitoring Versus 14 Day Novel Adhesive Patch Electrocardiographic Monitoring. [place], 2014.  ZIO VS. HOLTER MONITORING The Zio monitor can be comfortably worn for up to 14 days. Holter monitors can be worn for 24 to 48 hours, limiting the time to record any irregular heart rhythms you may have. Zio is able to capture data for the 51% of patients who have their first symptom-triggered arrhythmia after 48 hours.1  LIVE WITHOUT RESTRICTIONS The Zio ambulatory cardiac monitor is a small, unobtrusive, and water-resistant patch--you might even forget you're wearing it. The Zio monitor records and stores every beat of your heart, whether you're sleeping, working out, or showering. Remove 14 days after applying.     Follow-Up: At [facility], you and your health needs are our priority.  As part of our continuing mission to provide you with exceptional heart care, we have created designated Provider Care Teams.  These Care Teams include your primary Cardiologist (physician) and Advanced Practice Providers (APPs -  Physician Assistants and Nurse Practitioners) who all work together to provide you with the care you need, when you need it.  We recommend signing up for the patient portal called "MyChart".  Sign up information is provided on this After Visit Summary.  MyChart is used to connect with patients for Virtual Visits (Telemedicine).  Patients are able to view lab/test results, encounter notes, upcoming appointments, etc.  Non-urgent messages can be sent to your provider as well.  To learn more about what you can do with MyChart, go to [email or web address].    Your next appointment:    As needed  The format for your next appointment:   In Person  Provider:   Berniece Salines, DO [address], [place], [place] [address]    Other Instructions Echocardiogram An echocardiogram is a test  that uses sound waves (ultrasound) to produce images of the heart. Images from an echocardiogram can provide important information about: Heart size and shape. The size and thickness and movement of your heart's walls. Heart muscle function and strength. Heart valve function or if you have stenosis. Stenosis is when the heart valves are too narrow. If blood is flowing backward through the heart valves (regurgitation). A tumor or infectious growth around the heart valves. Areas of heart muscle that are not working well because of poor blood flow or injury from a heart attack. Aneurysm detection. An aneurysm is a weak or damaged part of an artery wall. The wall bulges out from the normal force of blood pumping through the body. Tell a health care provider about: Any allergies you have. All medicines you are taking, including vitamins, herbs, eye drops, creams, and over-the-counter medicines. Any blood disorders you have. Any surgeries you have had. Any medical conditions you have. Whether you are pregnant or may be pregnant. What are the risks? Generally, this is a safe test. However, problems may occur, including an allergic reaction to dye (contrast) that may be used during the test. What happens before the test? No specific preparation is needed. You may eat and drink normally. What happens during the test?  You will take off your clothes from the waist up and put on a hospital gown. Electrodes or electrocardiogram (ECG)patches may be placed on your chest. The electrodes or patches are then connected to a device that monitors your heart rate and rhythm. You will lie down on a table for an ultrasound exam. A gel will be applied to your chest to help sound waves pass through your skin. A handheld device, called a transducer, will be pressed against your chest and moved over your heart. The transducer produces sound waves that travel to your heart and bounce back (or "echo" back) to the  transducer. These sound waves will be captured in real-time and changed into images of your heart that can be viewed on a video monitor. The images will be recorded on a computer and reviewed by your health care provider. You may be asked to change positions or hold your breath for a short time. This makes it easier to get different views or better views of your heart. In some cases, you may receive contrast through an IV in one of your veins. This can improve the quality of the pictures from your heart. The procedure may vary among health care providers and hospitals. What can I expect after the test? You may return to your normal, everyday life, including diet, activities, and medicines, unless your health care provider tells you not to do that. Follow these instructions at home: It is up to you to get the results of your test. Ask your health care provider, or the department that is doing the test, when your results will be ready. Keep all follow-up visits. This is important. Summary An echocardiogram is a test that uses sound waves (ultrasound) to produce images of the heart. Images from an echocardiogram can provide important information about the size and shape of your heart, heart muscle  function, heart valve function, and other possible heart problems. You do not need to do anything to prepare before this test. You may eat and drink normally. After the echocardiogram is completed, you may return to your normal, everyday life, unless your health care provider tells you not to do that. This information is not intended to replace advice given to you by your health care provider. Make sure you discuss any questions you have with your health care provider. Document Revised: 01/13/2020 Document Reviewed: 01/13/2020 Elsevier Patient Education  2022 [employer].

## 2021-02-09 NOTE — Progress Notes (Signed)
Cardiology Office Note:    Date:  02/09/2021   ID:  Suzanne Beasley, DOB [birth year], MRN [id number]  PCP:  Ma Hillock, DO  Cardiologist:  Berniece Salines, DO  Electrophysiologist:  None   Referring MD: Ma Hillock, DO    History of Present Illness:    Suzanne Beasley is a 50 y.o. female with a hx of GERD, hypothyroidism, hyperlipidemia, PCOS and obesity is here today to be evaluated palpitation shortness of breath.  The patient tells me for many years she has been experiencing intermittent palpitations.  She notes 10 years ago she did wear a monitor which was normal.  But recently given the frequent episodes and one major episode she had August where she started experience the palpitation that episode got significantly worse when she got some shortness of breath.  She had to sit down thankfully her husband was home who stay with her until she felt better.  She did not have any chest pain and lightheadedness or dizziness.  Concerned given the frequency of the episodes and now with the associated shortness of breath.  Past Medical History:  Diagnosis Date   Fibroids    With myomectomy   Heartburn    Hyperlipidemia    diet contolled - no meds   Hypothyroidism    Lumbar herniated disc 02/08/2011   PCOS (polycystic ovarian syndrome)     Past Surgical History:  Procedure Laterality Date   DILATION AND EVACUATION N/A 10/08/2014   Procedure: DILATATION AND EVACUATION;  Surgeon: Linda Hedges, DO;  Location: [facility];  Service: Gynecology;  Laterality: N/A;   MYOMECTOMY  2011   abd    Current Medications: Current Meds  Medication Sig   hydrOXYzine (ATARAX/VISTARIL) 10 MG tablet Take 1 tablet (10 mg total) by mouth 3 (three) times daily as needed. (Patient taking differently: Take 10 mg by mouth 3 (three) times daily as needed for anxiety.)   levothyroxine (SYNTHROID) 125 MCG tablet Take 1 tablet (125 mcg total) by mouth daily before breakfast. Needs TSH level rechecked at 6 weeks    Multiple Vitamin (MULTIVITAMIN) tablet Take 1 tablet by mouth daily.     Allergies:   Shellfish allergy and Latex   Social History   Socioeconomic History   Marital status: Married    Spouse name: Not on file   Number of children: Not on file   Years of education: Not on file   Highest education level: Not on file  Occupational History   Not on file  Tobacco Use   Smoking status: Never   Smokeless tobacco: Never  Vaping Use   Vaping Use: Never used  Substance and Sexual Activity   Alcohol use: Yes    Alcohol/week: 0.0 standard drinks    Comment: occ   Drug use: No   Sexual activity: Yes    Partners: Male    Birth control/protection: None  Other Topics Concern   Not on file  Social History Narrative   Marital status/children/pets: Married   Education/employment: College degree.  Works in Programmer, applications at [employer]:      -smoke alarm in the home:Yes     - wears seatbelt: Yes     - Feels safe in their relationships: Yes   Social Determinants of Health   Financial Resource Strain: Not on file  Food Insecurity: Not on file  Transportation Needs: Not on file  Physical Activity: Not on file  Stress: Not on file  Social Connections: Not  on file     Family History: The patient's family history includes Alzheimer's disease in her paternal grandmother; Aneurysm in her father; Arthritis in her maternal grandmother; Asthma in her son; Breast cancer in her maternal aunt; Diabetes in her father, maternal grandmother, and paternal grandmother; Early death in her mother; Heart disease in her father; Hypertension in her father, maternal grandmother, and mother; Miscarriages / Stillbirths in her mother; Prostate cancer in her father; Uterine cancer in her mother.  ROS:   Review of Systems  Constitution: Negative for decreased appetite, fever and weight gain.  HENT: Negative for congestion, ear discharge, hoarse voice and sore throat.   Eyes: Negative for discharge, redness,  vision loss in right eye and visual halos.  Cardiovascular: Reports dyspnea on exertion, and palpitations.  Negative for leg swelling, orthopnea.  Respiratory: Negative for cough, hemoptysis, shortness of breath and snoring.   Endocrine: Negative for heat intolerance and polyphagia.  Hematologic/Lymphatic: Negative for bleeding problem. Does not bruise/bleed easily.  Skin: Negative for flushing, nail changes, rash and suspicious lesions.  Musculoskeletal: Negative for arthritis, joint pain, muscle cramps, myalgias, neck pain and stiffness.  Gastrointestinal: Negative for abdominal pain, bowel incontinence, diarrhea and excessive appetite.  Genitourinary: Negative for decreased libido, genital sores and incomplete emptying.  Neurological: Negative for brief paralysis, focal weakness, headaches and loss of balance.  Psychiatric/Behavioral: Negative for altered mental status, depression and suicidal ideas.  Allergic/Immunologic: Negative for HIV exposure and persistent infections.    EKGs/Labs/Other Studies Reviewed:    The following studies were reviewed today:   EKG:  The ekg ordered today demonstrates sinus rhythm, heart rate 80 bpm.  Recent Labs: 12/29/2020: ALT 11; BUN 19; Creatinine, Ser 1.14; Hemoglobin 13.7; Platelets 216.0; Potassium 4.4; Sodium 135; TSH 0.51  Recent Lipid Panel    Component Value Date/Time   CHOL 199 03/03/2020 0859   TRIG 83 03/03/2020 0859   HDL 56 03/03/2020 0859   CHOLHDL 3.6 03/03/2020 0859   VLDL 11 09/09/2014 0853   LDLCALC 125 (H) 03/03/2020 0859    Physical Exam:    VS:  BP 136/84 (BP Location: Right Arm, Patient Position: Sitting)   Pulse 80   Ht 5' 3"  (1.6 m)   Wt 209 lb (94.8 kg)   SpO2 99%   BMI 37.02 kg/m     Wt Readings from Last 3 Encounters:  02/09/21 209 lb (94.8 kg)  12/29/20 200 lb (90.7 kg)  10/20/20 200 lb (90.7 kg)     GEN: Well nourished, well developed in no acute distress HEENT: Normal NECK: No JVD; No carotid  bruits LYMPHATICS: No lymphadenopathy CARDIAC: S1S2 noted,RRR, no murmurs, rubs, gallops RESPIRATORY:  Clear to auscultation without rales, wheezing or rhonchi  ABDOMEN: Soft, non-tender, non-distended, +bowel sounds, no guarding. EXTREMITIES: No edema, No cyanosis, no clubbing MUSCULOSKELETAL:  No deformity  SKIN: Warm and dry NEUROLOGIC:  Alert and oriented x 3, non-focal PSYCHIATRIC:  Normal affect, good insight  ASSESSMENT:    1. Palpitations   2. SOB (shortness of breath)   3. Obesity (BMI 30-39.9)    PLAN:     I would like to rule out a cardiovascular etiology of this palpitation, therefore at this time I would like to placed a zio patch for  14  days. In additon with her shortness of breath a transthoracic echocardiogram will be ordered to assess LV/RV function and any structural abnormalities. Once these testing have been performed amd reviewed further reccomendations will be made. For now, I do reccomend  that the patient goes to the nearest ED if  symptoms recur.  The patient understands the need to lose weight with diet and exercise. We have discussed specific strategies for this.  Lipid profile in September 2021 total cholesterol 199, HDL 56, LDL 125 Non-LDL cholesterol 143  The patient is in agreement with the above plan. The patient left the office in stable condition.  The patient will follow up as needed.   Medication Adjustments/Labs and Tests Ordered: Current medicines are reviewed at length with the patient today.  Concerns regarding medicines are outlined above.  Orders Placed This Encounter  Procedures   LONG TERM MONITOR (3-14 DAYS)   EKG 12-Lead   ECHOCARDIOGRAM COMPLETE   No orders of the defined types were placed in this encounter.   Patient Instructions  Medication Instructions:  Your physician recommends that you continue on your current medications as directed. Please refer to the Current Medication list given to you today.  *If you need a refill  on your cardiac medications before your next appointment, please call your pharmacy*   Lab Work: None If you have labs (blood work) drawn today and your tests are completely normal, you will receive your results only by: [facility] (if you have MyChart) OR A paper copy in the mail If you have any lab test that is abnormal or we need to change your treatment, we will call you to review the results.   Testing/Procedures: Your physician has requested that you have an echocardiogram. Echocardiography is a painless test that uses sound waves to create images of your heart. It provides your doctor with information about the size and shape of your heart and how well your heart's chambers and valves are working. This procedure takes approximately one hour. There are no restrictions for this procedure.  A zio monitor was ordered today. It will remain on for 14 days. You will then return monitor and event diary in provided box. It takes 1-2 weeks for report to be downloaded and returned to [place]. We will call you with the results. If monitor falls off or has orange flashing light, please call Zio for further instructions.   ZIO  WHY IS MY DOCTOR PRESCRIBING ZIO? The Zio system is proven and trusted by physicians to detect and diagnose irregular heart rhythms -- and has been prescribed to hundreds of thousands of patients.  The FDA has cleared the Zio system to monitor for many different kinds of irregular heart rhythms. In a study, physicians were able to reach a diagnosis 90% of the time with the Zio system1.  You can wear the Zio monitor -- a small, discreet, comfortable patch -- during your normal day-to-day activity, including while you sleep, shower, and exercise, while it records every single heartbeat for analysis.  1Barrett, P., et al. Comparison of 24 Hour Holter Monitoring Versus 14 Day Novel Adhesive Patch Electrocardiographic Monitoring. [place], 2014.  ZIO VS.  HOLTER MONITORING The Zio monitor can be comfortably worn for up to 14 days. Holter monitors can be worn for 24 to 48 hours, limiting the time to record any irregular heart rhythms you may have. Zio is able to capture data for the 51% of patients who have their first symptom-triggered arrhythmia after 48 hours.1  LIVE WITHOUT RESTRICTIONS The Zio ambulatory cardiac monitor is a small, unobtrusive, and water-resistant patch--you might even forget you're wearing it. The Zio monitor records and stores every beat of your heart, whether you're sleeping, working out, or  showering. Remove 14 days after applying.     Follow-Up: At [facility], you and your health needs are our priority.  As part of our continuing mission to provide you with exceptional heart care, we have created designated Provider Care Teams.  These Care Teams include your primary Cardiologist (physician) and Advanced Practice Providers (APPs -  Physician Assistants and Nurse Practitioners) who all work together to provide you with the care you need, when you need it.  We recommend signing up for the patient portal called "MyChart".  Sign up information is provided on this After Visit Summary.  MyChart is used to connect with patients for Virtual Visits (Telemedicine).  Patients are able to view lab/test results, encounter notes, upcoming appointments, etc.  Non-urgent messages can be sent to your provider as well.   To learn more about what you can do with MyChart, go to [email or web address].    Your next appointment:    As needed  The format for your next appointment:   In Person  Provider:   Berniece Salines, DO [address], [place], [place] [address]    Other Instructions Echocardiogram An echocardiogram is a test that uses sound waves (ultrasound) to produce images of the heart. Images from an echocardiogram can provide important information about: Heart size and shape. The size and thickness and movement of  your heart's walls. Heart muscle function and strength. Heart valve function or if you have stenosis. Stenosis is when the heart valves are too narrow. If blood is flowing backward through the heart valves (regurgitation). A tumor or infectious growth around the heart valves. Areas of heart muscle that are not working well because of poor blood flow or injury from a heart attack. Aneurysm detection. An aneurysm is a weak or damaged part of an artery wall. The wall bulges out from the normal force of blood pumping through the body. Tell a health care provider about: Any allergies you have. All medicines you are taking, including vitamins, herbs, eye drops, creams, and over-the-counter medicines. Any blood disorders you have. Any surgeries you have had. Any medical conditions you have. Whether you are pregnant or may be pregnant. What are the risks? Generally, this is a safe test. However, problems may occur, including an allergic reaction to dye (contrast) that may be used during the test. What happens before the test? No specific preparation is needed. You may eat and drink normally. What happens during the test?  You will take off your clothes from the waist up and put on a hospital gown. Electrodes or electrocardiogram (ECG)patches may be placed on your chest. The electrodes or patches are then connected to a device that monitors your heart rate and rhythm. You will lie down on a table for an ultrasound exam. A gel will be applied to your chest to help sound waves pass through your skin. A handheld device, called a transducer, will be pressed against your chest and moved over your heart. The transducer produces sound waves that travel to your heart and bounce back (or "echo" back) to the transducer. These sound waves will be captured in real-time and changed into images of your heart that can be viewed on a video monitor. The images will be recorded on a computer and reviewed by your health  care provider. You may be asked to change positions or hold your breath for a short time. This makes it easier to get different views or better views of your heart. In some cases, you may  receive contrast through an IV in one of your veins. This can improve the quality of the pictures from your heart. The procedure may vary among health care providers and hospitals. What can I expect after the test? You may return to your normal, everyday life, including diet, activities, and medicines, unless your health care provider tells you not to do that. Follow these instructions at home: It is up to you to get the results of your test. Ask your health care provider, or the department that is doing the test, when your results will be ready. Keep all follow-up visits. This is important. Summary An echocardiogram is a test that uses sound waves (ultrasound) to produce images of the heart. Images from an echocardiogram can provide important information about the size and shape of your heart, heart muscle function, heart valve function, and other possible heart problems. You do not need to do anything to prepare before this test. You may eat and drink normally. After the echocardiogram is completed, you may return to your normal, everyday life, unless your health care provider tells you not to do that. This information is not intended to replace advice given to you by your health care provider. Make sure you discuss any questions you have with your health care provider. Document Revised: 01/13/2020 Document Reviewed: 01/13/2020 Elsevier Patient Education  2022 [employer].     Adopting a Healthy Lifestyle.  Know what a healthy weight is for you (roughly BMI <25) and aim to maintain this   Aim for 7+ servings of fruits and vegetables daily   65-80+ fluid ounces of water or unsweet tea for healthy kidneys   Limit to max 1 drink of alcohol per day; avoid smoking/tobacco   Limit animal fats in diet for  cholesterol and heart health - choose grass fed whenever available   Avoid highly processed foods, and foods high in saturated/trans fats   Aim for low stress - take time to unwind and care for your mental health   Aim for 150 min of moderate intensity exercise weekly for heart health, and weights twice weekly for bone health   Aim for 7-9 hours of sleep daily   When it comes to diets, agreement about the perfect plan isnt easy to find, even among the experts. Experts at the [employer] developed an idea known as the Healthy Eating Plate. Just imagine a plate divided into logical, healthy portions.   The emphasis is on diet quality:   Load up on vegetables and fruits - one-half of your plate: Aim for color and variety, and remember that potatoes dont count.   Go for whole grains - one-quarter of your plate: Whole wheat, barley, wheat berries, quinoa, oats, brown rice, and foods made with them. If you want pasta, go with whole wheat pasta.   Protein power - one-quarter of your plate: Fish, chicken, beans, and nuts are all healthy, versatile protein sources. Limit red meat.   The diet, however, does go beyond the plate, offering a few other suggestions.   Use healthy plant oils, such as olive, canola, soy, corn, sunflower and peanut. Check the labels, and avoid partially hydrogenated oil, which have unhealthy trans fats.   If youre thirsty, drink water. Coffee and tea are good in moderation, but skip sugary drinks and limit milk and dairy products to one or two daily servings.   The type of carbohydrate in the diet is more important than the amount. Some sources of carbohydrates,  such as vegetables, fruits, whole grains, and beans-are healthier than others.   Finally, stay active  Signed, Berniece Salines, DO  02/09/2021 12:57 PM    [facility]

## 2021-02-14 DIAGNOSIS — R002 Palpitations: Secondary | ICD-10-CM

## 2021-02-16 ENCOUNTER — Ambulatory Visit (HOSPITAL_BASED_OUTPATIENT_CLINIC_OR_DEPARTMENT_OTHER)
Admission: RE | Admit: 2021-02-16 | Discharge: 2021-02-16 | Disposition: A | Discharge status: Home / Self Care | Payer: Commercial Managed Care - PPO | Source: Ambulatory Visit | Attending: Cardiology | Admitting: Cardiology

## 2021-02-16 ENCOUNTER — Other Ambulatory Visit: Payer: Self-pay

## 2021-02-16 DIAGNOSIS — R0602 Shortness of breath: Secondary | ICD-10-CM | POA: Insufficient documentation

## 2021-02-16 LAB — ECHOCARDIOGRAM COMPLETE
AR max vel: 2.5 cm2
AV Area VTI: 2.56 cm2
AV Area mean vel: 2.26 cm2
AV Mean grad: 4 mmHg
AV Peak grad: 6.1 mmHg
Ao pk vel: 1.23 m/s
Area-P 1/2: 3.65 cm2
Calc EF: 69.2 %
S' Lateral: 2.84 cm
Single Plane A2C EF: 73.4 %
Single Plane A4C EF: 63 %

## 2021-03-07 ENCOUNTER — Encounter: Payer: Self-pay | Admitting: Family Medicine

## 2021-03-07 ENCOUNTER — Ambulatory Visit (INDEPENDENT_AMBULATORY_CARE_PROVIDER_SITE_OTHER): Payer: Commercial Managed Care - PPO | Admitting: Family Medicine

## 2021-03-07 ENCOUNTER — Other Ambulatory Visit: Payer: Self-pay

## 2021-03-07 VITALS — BP 109/71 | HR 88 | Temp 98.0°F | Ht 63.0 in | Wt 213.0 lb

## 2021-03-07 DIAGNOSIS — Z131 Encounter for screening for diabetes mellitus: Secondary | ICD-10-CM

## 2021-03-07 DIAGNOSIS — E063 Autoimmune thyroiditis: Secondary | ICD-10-CM

## 2021-03-07 DIAGNOSIS — Z0000 Encounter for general adult medical examination without abnormal findings: Secondary | ICD-10-CM | POA: Diagnosis not present

## 2021-03-07 DIAGNOSIS — E039 Hypothyroidism, unspecified: Secondary | ICD-10-CM

## 2021-03-07 DIAGNOSIS — E782 Mixed hyperlipidemia: Secondary | ICD-10-CM | POA: Diagnosis not present

## 2021-03-07 DIAGNOSIS — E038 Other specified hypothyroidism: Secondary | ICD-10-CM | POA: Diagnosis not present

## 2021-03-07 DIAGNOSIS — E669 Obesity, unspecified: Secondary | ICD-10-CM | POA: Diagnosis not present

## 2021-03-07 LAB — HEMOGLOBIN A1C: Hgb A1c MFr Bld: 5.7 % (ref 4.6–6.5)

## 2021-03-07 MED ORDER — LEVOTHYROXINE SODIUM 125 MCG PO TABS: 125.0000 ug | ORAL_TABLET | Freq: Every day | ORAL | 6 refills | Status: DC

## 2021-03-07 NOTE — Progress Notes (Signed)
This visit occurred during the SARS-CoV-2 public health emergency.  Safety protocols were in place, including screening questions prior to the visit, additional usage of staff PPE, and extensive cleaning of exam room while observing appropriate contact time as indicated for disinfecting solutions.    Patient ID: Suzanne Beasley, female  DOB: 03/26/1971, 50 y.o.   MRN: [id number] Patient Care Team    Relationship Specialty Notifications Start End  Ma Hillock, DO PCP - General Family Medicine  10/20/20   Berniece Salines, DO PCP - Cardiology Cardiology  02/09/21   Tamela Gammon, NP Nurse Practitioner Gynecology  10/21/20     Chief Complaint  Patient presents with   Annual Exam    Pt is not fasting    Subjective: Suzanne Beasley is a 50 y.o.  Female  present for CPE . All past medical history, surgical history, allergies, family history, immunizations, medications and social history were updated in the electronic medical record today. All recent labs, ED visits and hospitalizations within the last year were reviewed.  Health maintenance:  Colonoscopy: Cologuard negative 11/2020- rpt 3 yrs.  Mammogram: completed:10/18/2020-gyn Cervical cancer screening: last pap: 2021- gyn Immunizations: tdap UTD 2017, Influenza declined (encouraged yearly), covid series completed Infectious disease screening: HIV and Hep C completed DEXA:routine screen Assistive device: none Oxygen JIR:CVEL Patient has a Dental home. Hospitalizations/ED visits: reviewed  Depression screen [facility] 2/9 03/07/2021 10/21/2020  Decreased Interest 0 0  Down, Depressed, Hopeless 0 0  PHQ - 2 Score 0 0   No flowsheet data found.   Immunization History  Administered Date(s) Administered   PFIZER(Purple Top)SARS-COV-2 Vaccination 04/07/2020, 08/12/2020, 09/07/2020   Tdap 06/17/2015    Past Medical History:  Diagnosis Date   Fibroids    With myomectomy   Heartburn    Hyperlipidemia    diet contolled - no meds    Hypothyroidism    Lumbar herniated disc 02/08/2011   PCOS (polycystic ovarian syndrome)    Allergies  Allergen Reactions   Shellfish Allergy Nausea Only   Latex Itching   Past Surgical History:  Procedure Laterality Date   DILATION AND EVACUATION N/A 10/08/2014   Procedure: DILATATION AND EVACUATION;  Surgeon: Linda Hedges, DO;  Location: [facility];  Service: Gynecology;  Laterality: N/A;   MYOMECTOMY  2011   abd   Family History  Problem Relation Age of Onset   Hypertension Mother    Uterine cancer Mother    Early death Mother    49 / Stillbirths Mother    Diabetes Father    Hypertension Father    Heart disease Father    Prostate cancer Father    Aneurysm Father    Breast cancer Maternal Aunt    Asthma Son    Arthritis Maternal Grandmother    Diabetes Maternal Grandmother    Hypertension Maternal Grandmother    Diabetes Paternal Grandmother    Alzheimer's disease Paternal Grandmother    Social History   Social History Narrative   Marital status/children/pets: Married   Education/employment: Gaffer.  Works in Programmer, applications at [employer]:      -smoke alarm in the home:Yes     - wears seatbelt: Yes     - Feels safe in their relationships: Yes    Allergies as of 03/07/2021       Reactions   Shellfish Allergy Nausea Only   Latex Itching        Medication List  Accurate as of March 07, 2021  2:19 PM. If you have any questions, ask your nurse or doctor.          hydrOXYzine 10 MG tablet Commonly known as: ATARAX/VISTARIL Take 1 tablet (10 mg total) by mouth 3 (three) times daily as needed. What changed: reasons to take this   levothyroxine 125 MCG tablet Commonly known as: Synthroid Take 1 tablet (125 mcg total) by mouth daily before breakfast. Needs TSH level rechecked at 6 weeks   multivitamin tablet Take 1 tablet by mouth daily.        All past medical history, surgical history, allergies, family history,  immunizations andmedications were updated in the EMR today and reviewed under the history and medication portions of their EMR.      ECHOCARDIOGRAM COMPLETE Result Date: 02/16/2021 IMPRESSIONS  1. Left ventricular ejection fraction, by estimation, is 60 to 65%. The left ventricle has normal function. The left ventricle has no regional wall motion abnormalities. Left ventricular diastolic parameters were normal.  2. Right ventricular systolic function is normal. The right ventricular size is normal.  3. The mitral valve is normal in structure. Mild mitral valve regurgitation. No evidence of mitral stenosis.  4. The aortic valve is normal in structure. Aortic valve regurgitation is not visualized. No aortic stenosis is present.  5. The inferior vena cava is normal in size with greater than 50% respiratory variability, suggesting right atrial pressure of 3 mmHg.   ROS: 14 pt review of systems performed and negative (unless mentioned in an HPI)  Objective: BP 109/71   Pulse 88   Temp 98 F (36.7 C) (Oral)   Ht 5' 3"  (1.6 m)   Wt 213 lb (96.6 kg)   SpO2 100%   BMI 37.73 kg/m  Gen: Afebrile. No acute distress. Nontoxic in appearance, well-developed, well-nourished,  very pleasant obese female.  HENT: AT. [place]. Bilateral TM visualized and normal in appearance, normal external auditory canal. MMM, no oral lesions, adequate dentition. Bilateral nares within normal limits. Throat without erythema, ulcerations or exudates. no Cough on exam, no hoarseness on exam. Eyes:Pupils Equal Round Reactive to light, Extraocular movements intact,  Conjunctiva without redness, discharge or icterus. Neck/lymp/endocrine: Supple,no lymphadenopathy, no thyromegaly CV: RRR no murmur, no edema, +2/4 P posterior tibialis pulses.  Chest: CTAB, no wheeze, rhonchi or crackles. normal Respiratory effort. good Air movement. Abd: Soft. flat. NTND. BS present. no Masses palpated. No hepatosplenomegaly. No rebound tenderness or  guarding. Skin: no rashes, purpura or petechiae. Warm and well-perfused. Skin intact. Neuro/Msk: Normal gait. PERLA. EOMi. Alert. Oriented x3.  Cranial nerves II through XII intact. Muscle strength 5/5 upper/lower extremity. DTRs equal bilaterally. Psych: Normal affect, dress and demeanor. Normal speech. Normal thought content and judgment.   No results found.  Assessment/plan: Suzanne Beasley is a 50 y.o. female present for CPE  Obesity (BMI 30-39.9)/Mixed hyperlipidemia - Lipid panel Hypothyroidism, unspecified type Stable. - levothyroxine (SYNTHROID) 125 MCG tablet; Take 1 tablet (125 mcg total) by mouth daily before breakfast.  Diabetes mellitus screening - Hemoglobin A1c Routine general medical examination at a health care facility Colonoscopy: Cologuard negative 11/2020- rpt 3 yrs.  Mammogram: completed:10/18/2020-gyn Cervical cancer screening: last pap: 2021- gyn Immunizations: tdap UTD 2017, Influenza declined (encouraged yearly), covid series completed Infectious disease screening: HIV and Hep C completed DEXA:routine screen Patient was encouraged to exercise greater than 150 minutes a week. Patient was encouraged to choose a diet filled with fresh fruits and vegetables, and lean meats. AVS provided  to patient today for education/recommendation on gender specific health and safety maintenance.  Return in about 1 year (around 03/08/2022) for CPE (30 min).  Orders Placed This Encounter  Procedures   Hemoglobin A1c   Lipid panel    Orders Placed This Encounter  Procedures   Hemoglobin A1c   Lipid panel   Meds ordered this encounter  Medications   levothyroxine (SYNTHROID) 125 MCG tablet    Sig: Take 1 tablet (125 mcg total) by mouth daily before breakfast. Needs TSH level rechecked at 6 weeks    Dispense:  30 tablet    Refill:  6   Referral Orders  No referral(s) requested today     Electronically signed by: Howard Pouch, [place]

## 2021-03-07 NOTE — Patient Instructions (Signed)
Great to see you today.    If labs were collected, we will inform you of lab results once received either by echart message or telephone call.   - echart message- for normal results that have been seen by the patient already.   - telephone call: abnormal results or if patient has not viewed results in their echart.   Health Maintenance, Female Adopting a healthy lifestyle and getting preventive care are important in promoting health and wellness. Ask your health care provider about: The right schedule for you to have regular tests and exams. Things you can do on your own to prevent diseases and keep yourself healthy. What should I know about diet, weight, and exercise? Eat a healthy diet  Eat a diet that includes plenty of vegetables, fruits, low-fat dairy products, and lean protein. Do not eat a lot of foods that are high in solid fats, added sugars, or sodium. Maintain a healthy weight Body mass index (BMI) is used to identify weight problems. It estimates body fat based on height and weight. Your health care provider can help determine your BMI and help you achieve or maintain a healthy weight. Get regular exercise Get regular exercise. This is one of the most important things you can do for your health. Most adults should: Exercise for at least 150 minutes each week. The exercise should increase your heart rate and make you sweat (moderate-intensity exercise). Do strengthening exercises at least twice a week. This is in addition to the moderate-intensity exercise. Spend less time sitting. Even light physical activity can be beneficial. Watch cholesterol and blood lipids Have your blood tested for lipids and cholesterol at 48 years of age, then have this test every 5 years. Have your cholesterol levels checked more often if: Your lipid or cholesterol levels are high. You are older than 48 years of age. You are at high risk for heart disease. What should I know about cancer  screening? Depending on your health history and family history, you may need to have cancer screening at various ages. This may include screening for: Breast cancer. Cervical cancer. Colorectal cancer. Skin cancer. Lung cancer. What should I know about heart disease, diabetes, and high blood pressure? Blood pressure and heart disease High blood pressure causes heart disease and increases the risk of stroke. This is more likely to develop in people who have high blood pressure readings, are of African descent, or are overweight. Have your blood pressure checked: Every 3-5 years if you are 9-24 years of age. Every year if you are 37 years old or older. Diabetes Have regular diabetes screenings. This checks your fasting blood sugar level. Have the screening done: Once every three years after age 14 if you are at a normal weight and have a low risk for diabetes. More often and at a younger age if you are overweight or have a high risk for diabetes. What should I know about preventing infection? Hepatitis B If you have a higher risk for hepatitis B, you should be screened for this virus. Talk with your health care provider to find out if you are at risk for hepatitis B infection. Hepatitis C Testing is recommended for: Everyone born from [birth year] through 1963. Anyone with known risk factors for hepatitis C. Sexually transmitted infections (STIs) Get screened for STIs, including gonorrhea and chlamydia, if: You are sexually active and are younger than 48 years of age. You are older than 48 years of age and your health care provider tells you  that you are at risk for this type of infection. Your sexual activity has changed since you were last screened, and you are at increased risk for chlamydia or gonorrhea. Ask your health care provider if you are at risk. Ask your health care provider about whether you are at high risk for HIV. Your health care provider may recommend a prescription medicine to  help prevent HIV infection. If you choose to take medicine to prevent HIV, you should first get tested for HIV. You should then be tested every 3 months for as long as you are taking the medicine. Pregnancy If you are about to stop having your period (premenopausal) and you may become pregnant, seek counseling before you get pregnant. Take 400 to 800 micrograms (mcg) of folic acid every day if you become pregnant. Ask for birth control (contraception) if you want to prevent pregnancy. Osteoporosis and menopause Osteoporosis is a disease in which the bones lose minerals and strength with aging. This can result in bone fractures. If you are 27 years old or older, or if you are at risk for osteoporosis and fractures, ask your health care provider if you should: Be screened for bone loss. Take a calcium or vitamin D supplement to lower your risk of fractures. Be given hormone replacement therapy (HRT) to treat symptoms of menopause. Follow these instructions at home: Lifestyle Do not use any products that contain nicotine or tobacco, such as cigarettes, e-cigarettes, and chewing tobacco. If you need help quitting, ask your health care provider. Do not use street drugs. Do not share needles. Ask your health care provider for help if you need support or information about quitting drugs. Alcohol use Do not drink alcohol if: Your health care provider tells you not to drink. You are pregnant, may be pregnant, or are planning to become pregnant. If you drink alcohol: Limit how much you use to 0-1 drink a day. Limit intake if you are breastfeeding. Be aware of how much alcohol is in your drink. In the [place] one drink equals one 12 oz bottle of beer (355 mL), one 5 oz glass of wine (148 mL), or one 1 oz glass of hard liquor (44 mL). General instructions Schedule regular health, dental, and eye exams. Stay current with your vaccines. Tell your health care provider if: You often feel depressed. You  have ever been abused or do not feel safe at home. Summary Adopting a healthy lifestyle and getting preventive care are important in promoting health and wellness. Follow your health care provider's instructions about healthy diet, exercising, and getting tested or screened for diseases. Follow your health care provider's instructions on monitoring your cholesterol and blood pressure. This information is not intended to replace advice given to you by your health care provider. Make sure you discuss any questions you have with your health care provider. Document Revised: 07/30/2020 Document Reviewed: 05/15/2018 Elsevier Patient Education  2022 [employer].

## 2021-03-08 LAB — LIPID PANEL
Cholesterol: 211 mg/dL — ABNORMAL HIGH (ref 0–200)
HDL: 77.8 mg/dL (ref >39.00)
LDL Cholesterol: 121 mg/dL — ABNORMAL HIGH (ref 0–99)
NonHDL: 132.72
Total CHOL/HDL Ratio: 3
Triglycerides: 61 mg/dL (ref 0.0–149.0)
VLDL: 12.2 mg/dL (ref 0.0–40.0)

## 2022-02-15 ENCOUNTER — Other Ambulatory Visit: Payer: Self-pay

## 2022-02-15 DIAGNOSIS — E039 Hypothyroidism, unspecified: Secondary | ICD-10-CM

## 2022-02-22 ENCOUNTER — Other Ambulatory Visit: Payer: Self-pay

## 2022-02-22 DIAGNOSIS — E039 Hypothyroidism, unspecified: Secondary | ICD-10-CM

## 2022-02-28 ENCOUNTER — Other Ambulatory Visit: Payer: Self-pay

## 2022-02-28 DIAGNOSIS — E039 Hypothyroidism, unspecified: Secondary | ICD-10-CM

## 2022-03-02 ENCOUNTER — Telehealth: Payer: Self-pay | Admitting: Family Medicine

## 2022-03-02 DIAGNOSIS — E039 Hypothyroidism, unspecified: Secondary | ICD-10-CM

## 2022-03-02 MED ORDER — LEVOTHYROXINE SODIUM 125 MCG PO TABS: 125.0000 ug | ORAL_TABLET | Freq: Every day | ORAL | 0 refills | Status: DC

## 2022-03-02 NOTE — Telephone Encounter (Signed)
Rx sent 

## 2022-03-02 NOTE — Telephone Encounter (Signed)
Pt is needing refill for her synthroid. She is scheduled for an appointment on 03/08/22

## 2022-03-08 ENCOUNTER — Ambulatory Visit (INDEPENDENT_AMBULATORY_CARE_PROVIDER_SITE_OTHER): Payer: Commercial Managed Care - PPO | Admitting: Family Medicine

## 2022-03-08 ENCOUNTER — Other Ambulatory Visit: Payer: Self-pay | Admitting: Family Medicine

## 2022-03-08 ENCOUNTER — Encounter: Payer: Self-pay | Admitting: Family Medicine

## 2022-03-08 VITALS — BP 116/83 | HR 68 | Temp 98.4°F | Ht 65.16 in | Wt 237.2 lb

## 2022-03-08 DIAGNOSIS — Z79899 Other long term (current) drug therapy: Secondary | ICD-10-CM

## 2022-03-08 DIAGNOSIS — R002 Palpitations: Secondary | ICD-10-CM

## 2022-03-08 DIAGNOSIS — E038 Other specified hypothyroidism: Secondary | ICD-10-CM

## 2022-03-08 DIAGNOSIS — E782 Mixed hyperlipidemia: Secondary | ICD-10-CM

## 2022-03-08 DIAGNOSIS — E669 Obesity, unspecified: Secondary | ICD-10-CM

## 2022-03-08 DIAGNOSIS — E063 Autoimmune thyroiditis: Secondary | ICD-10-CM | POA: Diagnosis not present

## 2022-03-08 DIAGNOSIS — Z0000 Encounter for general adult medical examination without abnormal findings: Secondary | ICD-10-CM

## 2022-03-08 DIAGNOSIS — Z1231 Encounter for screening mammogram for malignant neoplasm of breast: Secondary | ICD-10-CM

## 2022-03-08 DIAGNOSIS — E039 Hypothyroidism, unspecified: Secondary | ICD-10-CM

## 2022-03-08 DIAGNOSIS — Z23 Encounter for immunization: Secondary | ICD-10-CM

## 2022-03-08 LAB — LIPID PANEL
Cholesterol: 223 mg/dL — ABNORMAL HIGH (ref 0–200)
HDL: 68.4 mg/dL (ref >39.00)
LDL Cholesterol: 144 mg/dL — ABNORMAL HIGH (ref 0–99)
NonHDL: 154.56
Total CHOL/HDL Ratio: 3
Triglycerides: 54 mg/dL (ref 0.0–149.0)
VLDL: 10.8 mg/dL (ref 0.0–40.0)

## 2022-03-08 LAB — CBC WITH DIFFERENTIAL/PLATELET
Basophils Absolute: 0 10*3/uL (ref 0.0–0.1)
Basophils Relative: 0.7 % (ref 0.0–3.0)
Eosinophils Absolute: 0.1 10*3/uL (ref 0.0–0.7)
Eosinophils Relative: 1.6 % (ref 0.0–5.0)
HCT: 39.2 % (ref 36.0–46.0)
Hemoglobin: 13 g/dL (ref 12.0–15.0)
Lymphocytes Relative: 40 % (ref 12.0–46.0)
Lymphs Abs: 1.3 10*3/uL (ref 0.7–4.0)
MCHC: 33.3 g/dL (ref 30.0–36.0)
MCV: 91.5 fl (ref 78.0–100.0)
Monocytes Absolute: 0.3 10*3/uL (ref 0.1–1.0)
Monocytes Relative: 9.6 % (ref 3.0–12.0)
Neutro Abs: 1.6 10*3/uL (ref 1.4–7.7)
Neutrophils Relative %: 48.1 % (ref 43.0–77.0)
Platelets: 217 10*3/uL (ref 150.0–400.0)
RBC: 4.28 Mil/uL (ref 3.87–5.11)
RDW: 13 % (ref 11.5–15.5)
WBC: 3.4 10*3/uL — ABNORMAL LOW (ref 4.0–10.5)

## 2022-03-08 LAB — COMPREHENSIVE METABOLIC PANEL
ALT: 10 U/L (ref 0–35)
AST: 13 U/L (ref 0–37)
Albumin: 3.9 g/dL (ref 3.5–5.2)
Alkaline Phosphatase: 39 U/L (ref 39–117)
BUN: 13 mg/dL (ref 6–23)
CO2: 26 mEq/L (ref 19–32)
Calcium: 8.9 mg/dL (ref 8.4–10.5)
Chloride: 105 mEq/L (ref 96–112)
Creatinine, Ser: 1.18 mg/dL (ref 0.40–1.20)
GFR: 54.46 mL/min — ABNORMAL LOW (ref >60.00)
Glucose, Bld: 93 mg/dL (ref 70–99)
Potassium: 3.9 mEq/L (ref 3.5–5.1)
Sodium: 136 mEq/L (ref 135–145)
Total Bilirubin: 0.5 mg/dL (ref 0.2–1.2)
Total Protein: 6.7 g/dL (ref 6.0–8.3)

## 2022-03-08 LAB — HEMOGLOBIN A1C: Hgb A1c MFr Bld: 5.7 % (ref 4.6–6.5)

## 2022-03-08 LAB — TSH: TSH: 3.31 u[IU]/mL (ref 0.35–5.50)

## 2022-03-08 MED ORDER — LEVOTHYROXINE SODIUM 125 MCG PO TABS: 125.0000 ug | ORAL_TABLET | Freq: Every day | ORAL | 3 refills | Status: DC

## 2022-03-08 NOTE — Progress Notes (Signed)
Patient ID: Suzanne Beasley, female  DOB: [birth year], 51 y.o.   MRN: [id number] Patient Care Team    Relationship Specialty Notifications Start End  Ma Hillock, DO PCP - General Family Medicine  10/20/20   Berniece Salines, DO PCP - Cardiology Cardiology  02/09/21   Tamela Gammon, NP Nurse Practitioner Gynecology  10/21/20     Chief Complaint  Patient presents with   Annual Exam    Pt is not fasting    Subjective:  Suzanne Beasley is a 51 y.o.  Female  present for [place] All past medical history, surgical history, allergies, family history, immunizations, medications and social history were updated in the electronic medical record today. All recent labs, ED visits and hospitalizations within the last year were reviewed.  Health maintenance:  Colonoscopy: Cologuard negative 11/2020- rpt 3 yrs.  Mammogram: completed:10/18/2020-gyn > ordered for her today for [facility] Cervical cancer screening: last pap: 2021- gyn Immunizations: tdap UTD 2017, Influenza declined (encouraged yearly), covid series completed Infectious disease screening: HIV and Hep C completed DEXA:routine screen Assistive device: none Oxygen RXV:QMGQ Patient has a Dental home. Hospitalizations/ED visits: none  Hypothyroidism: Patient reports compliance with levo 125 mcg qd.     03/08/2022    9:15 AM 03/07/2021    1:02 PM 10/21/2020    1:18 PM  Depression screen PHQ 2/9  Decreased Interest 0 0 0  Down, Depressed, Hopeless 0 0 0  PHQ - 2 Score 0 0 0  Altered sleeping 0    Tired, decreased energy 1    Change in appetite 0    Feeling bad or failure about yourself  0    Trouble concentrating 0    Moving slowly or fidgety/restless 0    Suicidal thoughts 0    PHQ-9 Score 1        03/08/2022    9:15 AM  GAD 7 : Generalized Anxiety Score  Nervous, Anxious, on Edge 0  Control/stop worrying 0  Worry too much - different things 1  Trouble relaxing 0  Restless 0  Easily annoyed or irritable 1  Afraid -  awful might happen 0  Total GAD 7 Score 2    Immunization History  Administered Date(s) Administered   PFIZER(Purple Top)SARS-COV-2 Vaccination 04/07/2020, 08/12/2020, 09/07/2020   Tdap 06/17/2015   Past Medical History:  Diagnosis Date   Fibroids    With myomectomy   Heartburn    Hyperlipidemia    diet contolled - no meds   Hypothyroidism    Lumbar herniated disc 02/08/2011   PCOS (polycystic ovarian syndrome)    Allergies  Allergen Reactions   Shellfish Allergy Nausea Only   Latex Itching   Past Surgical History:  Procedure Laterality Date   DILATION AND EVACUATION N/A 10/08/2014   Procedure: DILATATION AND EVACUATION;  Surgeon: Linda Hedges, DO;  Location: [facility];  Service: Gynecology;  Laterality: N/A;   MYOMECTOMY  2011   abd   Family History  Problem Relation Age of Onset   Hypertension Mother    Uterine cancer Mother    Early death Mother    Miscarriages / Stillbirths Mother    Diabetes Father    Hypertension Father    Heart disease Father    Prostate cancer Father    Aneurysm Father    Breast cancer Maternal Aunt    Asthma Son    Arthritis Maternal Grandmother    Diabetes Maternal Grandmother    Hypertension Maternal Grandmother    Diabetes  Paternal Grandmother    Alzheimer's disease Paternal Grandmother    Social History   Social History Narrative   Marital status/children/pets: Married   Education/employment: Gaffer.  Works in Programmer, applications at [employer]:      -smoke alarm in the home:Yes     - wears seatbelt: Yes     - Feels safe in their relationships: Yes    Allergies as of 03/08/2022       Reactions   Shellfish Allergy Nausea Only   Latex Itching        Medication List        Accurate as of March 08, 2022  9:23 AM. If you have any questions, ask your nurse or doctor.          STOP taking these medications    hydrOXYzine 10 MG tablet Commonly known as: ATARAX Stopped by: Howard Pouch, DO       TAKE these  medications    levothyroxine 125 MCG tablet Commonly known as: Synthroid Take 1 tablet (125 mcg total) by mouth daily before breakfast. Needs TSH level rechecked at 6 weeks   multivitamin tablet Take 1 tablet by mouth daily.        All past medical history, surgical history, allergies, family history, immunizations andmedications were updated in the EMR today and reviewed under the history and medication portions of their EMR.     No results found for this or any previous visit (from the past 2160 hour(s)).  ECHOCARDIOGRAM COMPLETE Result Date: 02/16/2021 IMPRESSIONS   1. Left ventricular ejection fraction, by estimation, is 60 to 65%. The left ventricle has normal function. The left ventricle has no regional wall motion abnormalities. Left ventricular diastolic parameters were normal.   2. Right ventricular systolic function is normal. The right ventricular size is normal.   3. The mitral valve is normal in structure. Mild mitral valve regurgitation. No evidence of mitral stenosis.   4. The aortic valve is normal in structure. Aortic valve regurgitation is not visualized. No aortic stenosis is present.   5. The inferior vena cava is normal in size with greater than 50% respiratory variability, suggesting right atrial pressure of 3 mmHg.   ROS 14 pt review of systems performed and negative (unless mentioned in an HPI)  Objective: BP 116/83   Pulse 68   Temp 98.4 F (36.9 C)   Ht 5' 5.16" (1.655 m)   Wt 237 lb 3.2 oz (107.6 kg)   LMP 02/15/2022 (Approximate)   SpO2 98%   BMI 39.28 kg/m  Physical Exam Vitals and nursing note reviewed.  Constitutional:      General: She is not in acute distress.    Appearance: Normal appearance. She is not ill-appearing or toxic-appearing.  HENT:     Head: Normocephalic and atraumatic.     Right Ear: Tympanic membrane, ear canal and external ear normal. There is no impacted cerumen.     Left Ear: Tympanic membrane, ear canal and external  ear normal. There is no impacted cerumen.     Nose: No congestion or rhinorrhea.     Mouth/Throat:     Mouth: Mucous membranes are moist.     Pharynx: Oropharynx is clear. No oropharyngeal exudate or posterior oropharyngeal erythema.  Eyes:     General: No scleral icterus.       Right eye: No discharge.        Left eye: No discharge.     Extraocular Movements: Extraocular movements intact.  Conjunctiva/sclera: Conjunctivae normal.     Pupils: Pupils are equal, round, and reactive to light.  Cardiovascular:     Rate and Rhythm: Normal rate and regular rhythm.     Pulses: Normal pulses.     Heart sounds: Normal heart sounds. No murmur heard.    No friction rub. No gallop.  Pulmonary:     Effort: Pulmonary effort is normal. No respiratory distress.     Breath sounds: Normal breath sounds. No stridor. No wheezing, rhonchi or rales.  Chest:     Chest wall: No tenderness.  Abdominal:     General: Abdomen is flat. Bowel sounds are normal. There is no distension.     Palpations: Abdomen is soft. There is no mass.     Tenderness: There is no abdominal tenderness. There is no right CVA tenderness, left CVA tenderness, guarding or rebound.     Hernia: No hernia is present.  Musculoskeletal:        General: No swelling, tenderness or deformity. Normal range of motion.     Cervical back: Normal range of motion and neck supple. No rigidity or tenderness.     Right lower leg: No edema.     Left lower leg: No edema.  Lymphadenopathy:     Cervical: No cervical adenopathy.  Skin:    General: Skin is warm and dry.     Coloration: Skin is not jaundiced or pale.     Findings: No bruising, erythema, lesion or rash.  Neurological:     General: No focal deficit present.     Mental Status: She is alert and oriented to person, place, and time. Mental status is at baseline.     Cranial Nerves: No cranial nerve deficit.     Sensory: No sensory deficit.     Motor: No weakness.     Coordination:  Coordination normal.     Gait: Gait normal.     Deep Tendon Reflexes: Reflexes normal.  Psychiatric:        Mood and Affect: Mood normal.        Behavior: Behavior normal.        Thought Content: Thought content normal.        Judgment: Judgment normal.      No results found.  Assessment/plan: Suzanne Beasley is a 51 y.o. female present for CPE/CMC Hypothyroidism due to Hashimoto's thyroiditis Cont levo 125 mcg for now. refills will be provided in appropriate dose based on lab result today - TSH - Lipid panel Mixed hyperlipidemia/Obesity (BMI 30-39.9) Diet and exercise modifications recommended - CBC with Differential/Platelet - Comprehensive metabolic panel - TSH - Lipid panel Need for immunization against influenza declined Encounter for long-term current use of medication - Hemoglobin A1c Breast cancer screening by mammogram - MM DIGITAL SCREENING BILATERAL; Future Routine general medical examination at a health care facility Colonoscopy: Cologuard negative 11/2020- rpt 3 yrs.  Mammogram: completed:10/18/2020-gyn > ordered for her today for [facility] Cervical cancer screening: last pap: 2021- gyn Immunizations: tdap UTD 2017, Influenza declined (encouraged yearly), covid series completed Infectious disease screening: HIV and Hep C completed DEXA:routine screen Patient was encouraged to exercise greater than 150 minutes a week. Patient was encouraged to choose a diet filled with fresh fruits and vegetables, and lean meats. AVS provided to patient today for education/recommendation on gender specific health and safety maintenance.  Return in about 1 year (around 03/10/2023) for cpe (20 min), Routine chronic condition follow-up.  Orders Placed This Encounter  Procedures  MM DIGITAL SCREENING BILATERAL   CBC with Differential/Platelet   Comprehensive metabolic panel   Hemoglobin A1c   TSH   Lipid panel   No orders of the defined types were placed in this  encounter.  Referral Orders  No referral(s) requested today     Electronically signed by: Howard Pouch, [place]

## 2022-03-08 NOTE — Patient Instructions (Signed)
No follow-ups on file.        Great to see you today.  I have refilled the medication(s) we provide.   If labs were collected, we will inform you of lab results once received either by echart message or telephone call.   - echart message- for normal results that have been seen by the patient already.   - telephone call: abnormal results or if patient has not viewed results in their echart.  Health Maintenance, Female Adopting a healthy lifestyle and getting preventive care are important in promoting health and wellness. Ask your health care provider about: The right schedule for you to have regular tests and exams. Things you can do on your own to prevent diseases and keep yourself healthy. What should I know about diet, weight, and exercise? Eat a healthy diet  Eat a diet that includes plenty of vegetables, fruits, low-fat dairy products, and lean protein. Do not eat a lot of foods that are high in solid fats, added sugars, or sodium. Maintain a healthy weight Body mass index (BMI) is used to identify weight problems. It estimates body fat based on height and weight. Your health care provider can help determine your BMI and help you achieve or maintain a healthy weight. Get regular exercise Get regular exercise. This is one of the most important things you can do for your health. Most adults should: Exercise for at least 150 minutes each week. The exercise should increase your heart rate and make you sweat (moderate-intensity exercise). Do strengthening exercises at least twice a week. This is in addition to the moderate-intensity exercise. Spend less time sitting. Even light physical activity can be beneficial. Watch cholesterol and blood lipids Have your blood tested for lipids and cholesterol at 49 years of age, then have this test every 5 years. Have your cholesterol levels checked more often if: Your lipid or cholesterol levels are high. You are older than 49 years of  age. You are at high risk for heart disease. What should I know about cancer screening? Depending on your health history and family history, you may need to have cancer screening at various ages. This may include screening for: Breast cancer. Cervical cancer. Colorectal cancer. Skin cancer. Lung cancer. What should I know about heart disease, diabetes, and high blood pressure? Blood pressure and heart disease High blood pressure causes heart disease and increases the risk of stroke. This is more likely to develop in people who have high blood pressure readings or are overweight. Have your blood pressure checked: Every 3-5 years if you are 18-39 years of age. Every year if you are 40 years old or older. Diabetes Have regular diabetes screenings. This checks your fasting blood sugar level. Have the screening done: Once every three years after age 40 if you are at a normal weight and have a low risk for diabetes. More often and at a younger age if you are overweight or have a high risk for diabetes. What should I know about preventing infection? Hepatitis B If you have a higher risk for hepatitis B, you should be screened for this virus. Talk with your health care provider to find out if you are at risk for hepatitis B infection. Hepatitis C Testing is recommended for: Everyone born from 1945 through 1965. Anyone with known risk factors for hepatitis C. Sexually transmitted infections (STIs) Get screened for STIs, including gonorrhea and chlamydia, if: You are sexually active and are younger than 49 years of age. You are   older than 49 years of age and your health care provider tells you that you are at risk for this type of infection. Your sexual activity has changed since you were last screened, and you are at increased risk for chlamydia or gonorrhea. Ask your health care provider if you are at risk. Ask your health care provider about whether you are at high risk for HIV. Your health  care provider may recommend a prescription medicine to help prevent HIV infection. If you choose to take medicine to prevent HIV, you should first get tested for HIV. You should then be tested every 3 months for as long as you are taking the medicine. Pregnancy If you are about to stop having your period (premenopausal) and you may become pregnant, seek counseling before you get pregnant. Take 400 to 800 micrograms (mcg) of folic acid every day if you become pregnant. Ask for birth control (contraception) if you want to prevent pregnancy. Osteoporosis and menopause Osteoporosis is a disease in which the bones lose minerals and strength with aging. This can result in bone fractures. If you are 65 years old or older, or if you are at risk for osteoporosis and fractures, ask your health care provider if you should: Be screened for bone loss. Take a calcium or vitamin D supplement to lower your risk of fractures. Be given hormone replacement therapy (HRT) to treat symptoms of menopause. Follow these instructions at home: Alcohol use Do not drink alcohol if: Your health care provider tells you not to drink. You are pregnant, may be pregnant, or are planning to become pregnant. If you drink alcohol: Limit how much you have to: 0-1 drink a day. Know how much alcohol is in your drink. In the [place] one drink equals one 12 oz bottle of beer (355 mL), one 5 oz glass of wine (148 mL), or one 1 oz glass of hard liquor (44 mL). Lifestyle Do not use any products that contain nicotine or tobacco. These products include cigarettes, chewing tobacco, and vaping devices, such as e-cigarettes. If you need help quitting, ask your health care provider. Do not use street drugs. Do not share needles. Ask your health care provider for help if you need support or information about quitting drugs. General instructions Schedule regular health, dental, and eye exams. Stay current with your vaccines. Tell your health  care provider if: You often feel depressed. You have ever been abused or do not feel safe at home. Summary Adopting a healthy lifestyle and getting preventive care are important in promoting health and wellness. Follow your health care provider's instructions about healthy diet, exercising, and getting tested or screened for diseases. Follow your health care provider's instructions on monitoring your cholesterol and blood pressure. This information is not intended to replace advice given to you by your health care provider. Make sure you discuss any questions you have with your health care provider. Document Revised: 10/11/2020 Document Reviewed: 10/11/2020 Elsevier Patient Education  2023 Elsevier Inc.  

## 2022-03-31 ENCOUNTER — Other Ambulatory Visit: Payer: Self-pay

## 2022-03-31 DIAGNOSIS — E039 Hypothyroidism, unspecified: Secondary | ICD-10-CM

## 2022-05-05 ENCOUNTER — Other Ambulatory Visit (HOSPITAL_COMMUNITY)
Admission: RE | Admit: 2022-05-05 | Discharge: 2022-05-05 | Disposition: A | Discharge status: Home / Self Care | Payer: Commercial Managed Care - PPO | Source: Ambulatory Visit | Attending: Radiology | Admitting: Radiology

## 2022-05-05 ENCOUNTER — Ambulatory Visit
Admission: RE | Admit: 2022-05-05 | Discharge: 2022-05-05 | Disposition: A | Discharge status: Home / Self Care | Payer: Commercial Managed Care - PPO | Source: Ambulatory Visit | Attending: Family Medicine | Admitting: Family Medicine

## 2022-05-05 ENCOUNTER — Encounter: Payer: Self-pay | Admitting: Radiology

## 2022-05-05 ENCOUNTER — Ambulatory Visit (INDEPENDENT_AMBULATORY_CARE_PROVIDER_SITE_OTHER): Payer: Commercial Managed Care - PPO | Admitting: Radiology

## 2022-05-05 VITALS — BP 116/82 | Ht 63.5 in | Wt 236.0 lb

## 2022-05-05 DIAGNOSIS — Z01419 Encounter for gynecological examination (general) (routine) without abnormal findings: Secondary | ICD-10-CM

## 2022-05-05 DIAGNOSIS — Z1231 Encounter for screening mammogram for malignant neoplasm of breast: Secondary | ICD-10-CM

## 2022-05-05 NOTE — Progress Notes (Signed)
   DUNG PRIEN [birth year] [id number]   History:  51 y.o. G1P0 presents for annual exam. Has PCP, had negative cardiac workup this year. Up to date on screenings. Has some feeling of heat, no hot flashes per say. Periods still regular but shorter now. Elects for pap this year.  Gynecologic History Patient's last menstrual period was 05/05/2022 (exact date). Period Cycle (Days): 28 Period Duration (Days): 4 Period Pattern: Regular Menstrual Flow:  (light, moderate to heavy) Menstrual Control: Maxi pad Dysmenorrhea: (!) Severe Dysmenorrhea Symptoms: Cramping Contraception/Family planning: none Sexually active: yes Last Pap: 2021. Results were: normal Last mammogram: 05/05/22  Obstetric History OB History  Gravida Para Term Preterm AB Living  1 0     1 0  SAB IAB Ectopic Multiple Live Births  1            # Outcome Date GA Lbr Len/2nd Weight Sex Delivery Anes PTL Lv  1 SAB              The following portions of the patient's history were reviewed and updated as appropriate: allergies, current medications, past family history, past medical history, past social history, past surgical history, and problem list.  Review of Systems Pertinent items noted in HPI and remainder of comprehensive ROS otherwise negative.   Past medical history, past surgical history, family history and social history were all reviewed and documented in the EPIC chart.   Exam:  Vitals:   05/05/22 0855  BP: 116/82  Weight: 236 lb (107 kg)  Height: 5' 3.5" (1.613 m)   Body mass index is 41.15 kg/m.  General appearance:  Normal Thyroid:  Symmetrical, normal in size, without palpable masses or nodularity. Respiratory  Auscultation:  Clear without wheezing or rhonchi Cardiovascular  Auscultation:  Regular rate, without rubs, murmurs or gallops  Edema/varicosities:  Not grossly evident Abdominal  Soft,nontender, without masses, guarding or rebound.  Liver/spleen:  No organomegaly noted  Hernia:   None appreciated  Skin  Inspection:  Grossly normal Breasts: Examined lying and sitting.   Right: Without masses, retractions, nipple discharge or axillary adenopathy.   Left: Without masses, retractions, nipple discharge or axillary adenopathy. Genitourinary   Inguinal/mons:  Normal without inguinal adenopathy  External genitalia:  Normal appearing vulva with no masses, tenderness, or lesions  BUS/Urethra/Skene's glands:  Normal without masses or exudate  Vagina:  Normal appearing with normal color and discharge, no lesions  Cervix:  Normal appearing without discharge or lesions  Uterus:  Fibroid uterus.  Mobile, nontender  Adnexa/parametria:     Rt: Normal in size, without masses or tenderness.   Lt: Normal in size, without masses or tenderness.  Anus and perineum: Normal   Patient informed chaperone available to be present for breast and pelvic exam. Patient has requested no chaperone to be present. Patient has been advised what will be completed during breast and pelvic exam.   Assessment/Plan:   1. Well woman exam with routine gynecological exam - Mammo today - Cytology - PAP( [place])     Discussed SBE, colonoscopy and DEXA screening as directed/appropriate. Recommend 168mns of exercise weekly, including weight bearing exercise. Encouraged the use of seatbelts and sunscreen. Return in 1 year for annual or as needed.   CRubbie BattiestB WHNP-BC 9:25 AM 05/05/2022

## 2022-05-10 LAB — CYTOLOGY - PAP
Adequacy: ABSENT
Comment: NEGATIVE
Diagnosis: NEGATIVE
Diagnosis: REACTIVE
High risk HPV: NEGATIVE

## 2022-09-29 ENCOUNTER — Ambulatory Visit: Payer: 59 | Admitting: Family Medicine

## 2022-09-29 VITALS — BP 130/88 | HR 69 | Temp 98.2°F | Wt 236.4 lb

## 2022-09-29 DIAGNOSIS — E038 Other specified hypothyroidism: Secondary | ICD-10-CM

## 2022-09-29 DIAGNOSIS — R5383 Other fatigue: Secondary | ICD-10-CM

## 2022-09-29 DIAGNOSIS — E063 Autoimmune thyroiditis: Secondary | ICD-10-CM

## 2022-09-29 LAB — CBC WITH DIFFERENTIAL/PLATELET
Basophils Absolute: 0 10*3/uL (ref 0.0–0.1)
Basophils Relative: 0.9 % (ref 0.0–3.0)
Eosinophils Absolute: 0.1 10*3/uL (ref 0.0–0.7)
Eosinophils Relative: 1.3 % (ref 0.0–5.0)
HCT: 40.7 % (ref 36.0–46.0)
Hemoglobin: 13.7 g/dL (ref 12.0–15.0)
Lymphocytes Relative: 41.5 % (ref 12.0–46.0)
Lymphs Abs: 1.9 10*3/uL (ref 0.7–4.0)
MCHC: 33.7 g/dL (ref 30.0–36.0)
MCV: 87.9 fl (ref 78.0–100.0)
Monocytes Absolute: 0.5 10*3/uL (ref 0.1–1.0)
Monocytes Relative: 9.7 % (ref 3.0–12.0)
Neutro Abs: 2.2 10*3/uL (ref 1.4–7.7)
Neutrophils Relative %: 46.6 % (ref 43.0–77.0)
Platelets: 252 10*3/uL (ref 150.0–400.0)
RBC: 4.63 Mil/uL (ref 3.87–5.11)
RDW: 13.1 % (ref 11.5–15.5)
WBC: 4.7 10*3/uL (ref 4.0–10.5)

## 2022-09-29 LAB — IBC + FERRITIN
Ferritin: 43.7 ng/mL (ref 10.0–291.0)
Iron: 74 ug/dL (ref 42–145)
Saturation Ratios: 21.1 % (ref 20.0–50.0)
TIBC: 350 ug/dL (ref 250.0–450.0)
Transferrin: 250 mg/dL (ref 212.0–360.0)

## 2022-09-29 LAB — TSH: TSH: 0.58 u[IU]/mL (ref 0.35–5.50)

## 2022-09-29 LAB — VITAMIN D 25 HYDROXY (VIT D DEFICIENCY, FRACTURES): VITD: 16.52 ng/mL — ABNORMAL LOW (ref 30.00–100.00)

## 2022-09-29 LAB — VITAMIN B12: Vitamin B-12: 830 pg/mL (ref 211–911)

## 2022-09-29 NOTE — Patient Instructions (Signed)
No follow-ups on file.        Great to see you today.  I have refilled the medication(s) we provide.   If labs were collected, we will inform you of lab results once received either by echart message or telephone call.   - echart message- for normal results that have been seen by the patient already.   - telephone call: abnormal results or if patient has not viewed results in their echart.  

## 2022-09-29 NOTE — Progress Notes (Signed)
Patient ID: Suzanne Beasley, female  DOB: 1970/08/04, 52 y.o.   MRN: [id number] Patient Care Team    Relationship Specialty Notifications Start End  Natalia Leatherwood, DO PCP - General Family Medicine  10/20/20   Thomasene Ripple, DO PCP - Cardiology Cardiology  02/09/21   Olivia Mackie, NP Nurse Practitioner Gynecology  10/21/20     Chief Complaint  Patient presents with   Fatigue    Pt states she is now pescatarian; diet change end of January    Subjective:  Suzanne Beasley is a 52 y.o.  Female  present for fatigue of 3 months duration All past medical history, surgical history, allergies, family history, immunizations, medications and social history were updated in the electronic medical record today. All recent labs, ED visits and hospitalizations within the last year were reviewed.   Fatigue: Patient reports she has noticed about 3 months of increased fatigue.  She did become a pescatarian around that time.  She is uncertain if it is related to the diet changes.  Has a history of hypothyroidism and Patient reports compliance with levo 125 mcg qd. She reports she never feels like she has enough rest.  She wakes a couple times during the night, she thinks to get up to urinate.  She does snore. Body mass index is 41.22 kg/m.  She reports she just has no motivation and feels exhausted.  She was working out a few times a week, and that has stopped due to lack of motivation.     03/08/2022    9:15 AM 03/07/2021    1:02 PM 10/21/2020    1:18 PM  Depression screen PHQ 2/9  Decreased Interest 0 0 0  Down, Depressed, Hopeless 0 0 0  PHQ - 2 Score 0 0 0  Altered sleeping 0    Tired, decreased energy 1    Change in appetite 0    Feeling bad or failure about yourself  0    Trouble concentrating 0    Moving slowly or fidgety/restless 0    Suicidal thoughts 0    PHQ-9 Score 1        03/08/2022    9:15 AM  GAD 7 : Generalized Anxiety Score  Nervous, Anxious, on Edge 0  Control/stop  worrying 0  Worry too much - different things 1  Trouble relaxing 0  Restless 0  Easily annoyed or irritable 1  Afraid - awful might happen 0  Total GAD 7 Score 2    Immunization History  Administered Date(s) Administered   PFIZER(Purple Top)SARS-COV-2 Vaccination 04/07/2020, 08/12/2020, 09/07/2020   Tdap 06/17/2015   Past Medical History:  Diagnosis Date   Fibroids    With myomectomy   Heartburn    Hyperlipidemia    diet contolled - no meds   Hypothyroidism    Lumbar herniated disc 02/08/2011   PCOS (polycystic ovarian syndrome)    Tachycardia    Allergies  Allergen Reactions   Shellfish Allergy Nausea Only   Latex Itching   Past Surgical History:  Procedure Laterality Date   DILATION AND EVACUATION N/A 10/08/2014   Procedure: DILATATION AND EVACUATION;  Surgeon: Mitchel Honour, DO;  Location: [facility];  Service: Gynecology;  Laterality: N/A;   MYOMECTOMY  2011   abd   Family History  Problem Relation Age of Onset   Hypertension Mother    Uterine cancer Mother    Early death Mother    Miscarriages / [place] Mother  Diabetes Father    Hypertension Father    Heart disease Father    Prostate cancer Father    Aneurysm Father    Breast cancer Maternal Aunt 58   Arthritis Maternal Grandmother    Diabetes Maternal Grandmother    Hypertension Maternal Grandmother    Diabetes Paternal Grandmother    Alzheimer's disease Paternal Grandmother    Asthma Son    Social History   Social History Narrative   Marital status/children/pets: Married   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:Yes     - wears seatbelt: Yes     - Feels safe in their relationships: Yes    Allergies as of 09/29/2022       Reactions   Shellfish Allergy Nausea Only   Latex Itching        Medication List        Accurate as of September 29, 2022  3:21 PM. If you have any questions, ask your nurse or doctor.           B-12 5000 MCG Subl Place under the tongue.   levothyroxine 125 MCG tablet Commonly known as: Synthroid Take 1 tablet (125 mcg total) by mouth daily before breakfast.   multivitamin tablet Take 1 tablet by mouth daily.        All past medical history, surgical history, allergies, family history, immunizations andmedications were updated in the EMR today and reviewed under the history and medication portions of their EMR.     No results found for this or any previous visit (from the past 2160 hour(s)).  ECHOCARDIOGRAM COMPLETE Result Date: 02/16/2021 IMPRESSIONS   1. Left ventricular ejection fraction, by estimation, is 60 to 65%. The left ventricle has normal function. The left ventricle has no regional wall motion abnormalities. Left ventricular diastolic parameters were normal.   2. Right ventricular systolic function is normal. The right ventricular size is normal.   3. The mitral valve is normal in structure. Mild mitral valve regurgitation. No evidence of mitral stenosis.   4. The aortic valve is normal in structure. Aortic valve regurgitation is not visualized. No aortic stenosis is present.   5. The inferior vena cava is normal in size with greater than 50% respiratory variability, suggesting right atrial pressure of 3 mmHg.   ROS 14 pt review of systems performed and negative (unless mentioned in an HPI)  Objective: BP 130/88   Pulse 69   Temp 98.2 F (36.8 C)   Wt 236 lb 6.4 oz (107.2 kg)   LMP 09/11/2022 (Approximate)   SpO2 98%   BMI 41.22 kg/m  Physical Exam Vitals and nursing note reviewed.  Constitutional:      General: She is not in acute distress.    Appearance: Normal appearance. She is normal weight. She is not ill-appearing or toxic-appearing.  HENT:     Head: Normocephalic and atraumatic.  Eyes:     General: No scleral icterus.       Right eye: No discharge.        Left eye: No discharge.     Extraocular Movements: Extraocular movements intact.      Conjunctiva/sclera: Conjunctivae normal.     Pupils: Pupils are equal, round, and reactive to light.  Musculoskeletal:     Cervical back: Neck supple. No rigidity or tenderness.  Lymphadenopathy:     Cervical: No cervical adenopathy.  Skin:    Findings:  No rash.  Neurological:     Mental Status: She is alert and oriented to person, place, and time. Mental status is at baseline.     Motor: No weakness.     Coordination: Coordination normal.     Gait: Gait normal.  Psychiatric:        Mood and Affect: Mood normal.        Behavior: Behavior normal.        Thought Content: Thought content normal.        Judgment: Judgment normal.      No results found.  Assessment/plan: Suzanne Beasley is a 52 y.o. female present for  Fatigue/Hypothyroidism due to Hashimoto's thyroiditis We discussed the large differential diagnosis including vitamin deficiency, iron deficiency, thyroid, anemia, sleep apnea, sleep disturbance Will start lab workup today. - Vitamin B12 - VITAMIN D 25 Hydroxy (Vit-D Deficiency, Fractures) - IBC + Ferritin - TSH - CBC w/Diff -Stop bang questionnaire: OSA intermediate risk.  Discussed if her lab work does not reveal reversible cause of her fatigue would consider referral to neurology for sleep apnea eval.  She is agreeable to this. Depending upon laboratory results .  No follow-ups on file.  Orders Placed This Encounter  Procedures   Vitamin B12   VITAMIN D 25 Hydroxy (Vit-D Deficiency, Fractures)   IBC + Ferritin   TSH   CBC w/Diff   No orders of the defined types were placed in this encounter.  Referral Orders  No referral(s) requested today     Electronically signed by: Felix Pacini, DO [facility]

## 2022-10-02 ENCOUNTER — Telehealth: Payer: Self-pay | Admitting: Family Medicine

## 2022-10-02 DIAGNOSIS — E039 Hypothyroidism, unspecified: Secondary | ICD-10-CM

## 2022-10-02 DIAGNOSIS — E559 Vitamin D deficiency, unspecified: Secondary | ICD-10-CM

## 2022-10-02 MED ORDER — LEVOTHYROXINE SODIUM 125 MCG PO TABS: 125.0000 ug | ORAL_TABLET | Freq: Every day | ORAL | 3 refills | Status: DC

## 2022-10-02 MED ORDER — VITAMIN D (ERGOCALCIFEROL) 1.25 MG (50000 UNIT) PO CAPS: 50000.0000 [IU] | ORAL_CAPSULE | ORAL | 0 refills | Status: DC

## 2022-10-02 NOTE — Telephone Encounter (Signed)
Patient called about lab results. I informed her they would give her a call regarding them, and she asked that I put a message in anyway.

## 2022-10-02 NOTE — Telephone Encounter (Signed)
Pt advised of results. 

## 2022-10-02 NOTE — Telephone Encounter (Signed)
Left VM for pt to return my call  

## 2022-10-02 NOTE — Telephone Encounter (Signed)
Please inform patient her vitamin D levels are very low at 16.  This is likely why she is fatigued.  I have called in a once weekly supplement of vitamin D that is best absorbed when taken with food.  Follow-up in 3 months with provider for recheck on vitamin D deficiency.    Blood cell counts, thyroid function, B12 and iron levels are normal.

## 2022-11-17 ENCOUNTER — Ambulatory Visit: Payer: 59 | Admitting: Obstetrics & Gynecology

## 2022-11-17 ENCOUNTER — Encounter: Payer: Self-pay | Admitting: Obstetrics & Gynecology

## 2022-11-17 VITALS — BP 118/80 | HR 58 | Resp 20

## 2022-11-17 DIAGNOSIS — B3731 Acute candidiasis of vulva and vagina: Secondary | ICD-10-CM | POA: Diagnosis not present

## 2022-11-17 DIAGNOSIS — N898 Other specified noninflammatory disorders of vagina: Secondary | ICD-10-CM

## 2022-11-17 DIAGNOSIS — A630 Anogenital (venereal) warts: Secondary | ICD-10-CM | POA: Diagnosis not present

## 2022-11-17 LAB — WET PREP FOR TRICH, YEAST, CLUE

## 2022-11-17 MED ORDER — FLUCONAZOLE 150 MG PO TABS: 150.0000 mg | ORAL_TABLET | ORAL | 0 refills | Status: AC

## 2022-11-17 NOTE — Progress Notes (Signed)
    Suzanne Beasley 1970/08/28 [id number]        52 y.o.  G1P0010   RP: Vaginal itching with a clear vaginal discharge, no odor x 2 days  HPI: Patient c/o Vaginal itching with a clear vaginal discharge, no odor x 2 days. Denies any urinary symptoms.    OB History  Gravida Para Term Preterm AB Living  1 0     1 0  SAB IAB Ectopic Multiple Live Births  1            # Outcome Date GA Lbr Len/2nd Weight Sex Delivery Anes PTL Lv  1 SAB             Past medical history,surgical history, problem list, medications, allergies, family history and social history were all reviewed and documented in the EPIC chart.   Directed ROS with pertinent positives and negatives documented in the history of present illness/assessment and plan.  Exam:  Vitals:   11/17/22 1110  BP: 118/80  Pulse: (!) 58  Resp: 20   General appearance:  Normal   Gynecologic exam: Vulva:  5 mm white elevated lesion c/w a small condyloma at the Rt anterior vulva.  Speculum:  Cervix/Vagina normal.  Increased discharge.  Wet prep done. SureSwab Advanced done.  Wet prep:  Yeasts present with budding   Assessment/Plan:  52 y.o. G1P0010   1. Vaginal discharge  Patient c/o Vaginal itching with a clear vaginal discharge, no odor x 2 days. Denies any urinary symptoms.  Yeast vaginitis confirmed by wet prep.  Will treat with Fluconazole, usage reviewed, prescription sent. - WET PREP FOR TRICH, YEAST, CLUE - SureSwab Advanced Vaginitis, TMA  2. 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  Offered treatment, declined.  Prefers observation as they have resolved without treatment in the past.  Will reevaluate at Annual-Gyn exam in 03/2023 with Jami C.  Genia Del MD, 11:16 AM 11/17/2022

## 2022-11-18 LAB — SURESWAB® ADVANCED VAGINITIS,TMA
CANDIDA SPECIES: DETECTED — AB
Candida glabrata: NOT DETECTED
SURESWAB(R) ADV BACTERIAL VAGINOSIS(BV),TMA: NEGATIVE
TRICHOMONAS VAGINALIS (TV),TMA: NOT DETECTED

## 2022-12-29 ENCOUNTER — Ambulatory Visit: Payer: Self-pay | Admitting: Family Medicine

## 2023-01-02 ENCOUNTER — Other Ambulatory Visit: Payer: Self-pay

## 2023-03-14 ENCOUNTER — Ambulatory Visit (INDEPENDENT_AMBULATORY_CARE_PROVIDER_SITE_OTHER): Payer: 59 | Admitting: Family Medicine

## 2023-03-14 ENCOUNTER — Encounter: Payer: Self-pay | Admitting: Family Medicine

## 2023-03-14 VITALS — BP 130/84 | HR 66 | Temp 98.0°F | Ht 63.0 in | Wt 243.0 lb

## 2023-03-14 DIAGNOSIS — Z131 Encounter for screening for diabetes mellitus: Secondary | ICD-10-CM

## 2023-03-14 DIAGNOSIS — E782 Mixed hyperlipidemia: Secondary | ICD-10-CM | POA: Diagnosis not present

## 2023-03-14 DIAGNOSIS — Z8249 Family history of ischemic heart disease and other diseases of the circulatory system: Secondary | ICD-10-CM

## 2023-03-14 DIAGNOSIS — Z1231 Encounter for screening mammogram for malignant neoplasm of breast: Secondary | ICD-10-CM

## 2023-03-14 DIAGNOSIS — E559 Vitamin D deficiency, unspecified: Secondary | ICD-10-CM | POA: Diagnosis not present

## 2023-03-14 DIAGNOSIS — Z23 Encounter for immunization: Secondary | ICD-10-CM

## 2023-03-14 DIAGNOSIS — Z0000 Encounter for general adult medical examination without abnormal findings: Secondary | ICD-10-CM | POA: Diagnosis not present

## 2023-03-14 DIAGNOSIS — E063 Autoimmune thyroiditis: Secondary | ICD-10-CM

## 2023-03-14 LAB — HEMOGLOBIN A1C: Hgb A1c MFr Bld: 5.6 % (ref 4.6–6.5)

## 2023-03-14 LAB — COMPREHENSIVE METABOLIC PANEL
ALT: 11 U/L (ref 0–35)
AST: 14 U/L (ref 0–37)
Albumin: 3.9 g/dL (ref 3.5–5.2)
Alkaline Phosphatase: 45 U/L (ref 39–117)
BUN: 13 mg/dL (ref 6–23)
CO2: 27 meq/L (ref 19–32)
Calcium: 9.3 mg/dL (ref 8.4–10.5)
Chloride: 107 meq/L (ref 96–112)
Creatinine, Ser: 1.02 mg/dL (ref 0.40–1.20)
GFR: 64.4 mL/min (ref >60.00)
Glucose, Bld: 97 mg/dL (ref 70–99)
Potassium: 4.1 meq/L (ref 3.5–5.1)
Sodium: 139 meq/L (ref 135–145)
Total Bilirubin: 0.4 mg/dL (ref 0.2–1.2)
Total Protein: 6.4 g/dL (ref 6.0–8.3)

## 2023-03-14 LAB — CBC
HCT: 40.6 % (ref 36.0–46.0)
Hemoglobin: 13.3 g/dL (ref 12.0–15.0)
MCHC: 32.8 g/dL (ref 30.0–36.0)
MCV: 88.9 fL (ref 78.0–100.0)
Platelets: 259 10*3/uL (ref 150.0–400.0)
RBC: 4.57 Mil/uL (ref 3.87–5.11)
RDW: 13 % (ref 11.5–15.5)
WBC: 3.8 10*3/uL — ABNORMAL LOW (ref 4.0–10.5)

## 2023-03-14 LAB — TSH: TSH: 0.07 u[IU]/mL — ABNORMAL LOW (ref 0.35–5.50)

## 2023-03-14 LAB — LIPID PANEL
Cholesterol: 194 mg/dL (ref 0–200)
HDL: 57.8 mg/dL (ref >39.00)
LDL Cholesterol: 122 mg/dL — ABNORMAL HIGH (ref 0–99)
NonHDL: 136.42
Total CHOL/HDL Ratio: 3
Triglycerides: 71 mg/dL (ref 0.0–149.0)
VLDL: 14.2 mg/dL (ref 0.0–40.0)

## 2023-03-14 LAB — VITAMIN D 25 HYDROXY (VIT D DEFICIENCY, FRACTURES): VITD: 34.78 ng/mL (ref 30.00–100.00)

## 2023-03-14 LAB — T4, FREE: Free T4: 1.31 ng/dL (ref 0.60–1.60)

## 2023-03-14 NOTE — Patient Instructions (Addendum)
Return in about 1 year (around 03/14/2024) for cpe (20 min).        Great to see you today.  I have refilled the medication(s) we provide.   If labs were collected or images ordered, we will inform you of  results once we have received them and reviewed. We will contact you either by echart message, or telephone call.  Please give ample time to the testing facility, and our office to run,  receive and review results. Please do not call inquiring of results, even if you can see them in your chart. We will contact you as soon as we are able. If it has been over 1 week since the test was completed, and you have not yet heard from [place], then please call us.    - echart message- for normal results that have been seen by the patient already.   - telephone call: abnormal results or if patient has not viewed results in their echart.  If a referral to a specialist was entered for you, please call us in 2 weeks if you have not heard from the specialist office to schedule.

## 2023-03-14 NOTE — Progress Notes (Signed)
Patient ID: Suzanne Beasley, female  DOB: [birth year], 52 y.o.   MRN: [id number] Patient Care Team    Relationship Specialty Notifications Start End  Natalia Leatherwood, DO PCP - General Family Medicine  10/20/20   Thomasene Ripple, DO PCP - Cardiology Cardiology  02/09/21   Olivia Mackie, NP Nurse Practitioner Gynecology  10/21/20     Chief Complaint  Patient presents with   Annual Exam    Pt is fasting    Subjective:  Suzanne Beasley is a 52 y.o.  Female  present for CPE and Chronic Conditions/illness Management  All past medical history, surgical history, allergies, family history, immunizations, medications and social history were updated in the electronic medical record today. All recent labs, ED visits and hospitalizations within the last year were reviewed.  Health maintenance:  Colonoscopy: Cologuard negative 11/2020- rpt 3 yrs.  Mammogram: completed: 05/05/2022- [facility].  Ordered for her today. Cervical cancer screening: last pap: 2023- gyn Immunizations: tdap UTD 2017, Influenza decliend (encouraged yearly), discussed Shingrix series to start after birthday by nurse visit if desired for series of 2. Infectious disease screening: HIV and Hep C completed DEXA:routine screen Assistive device: none Oxygen WUJ:WJXB Patient has a Dental home. Hospitalizations/ED visits: Reviewed  Hypothyroidism: Patient reports compliance with levo qd.  Vitamin D deficiency: Diagnosed 09/29/2022 with symptoms of fatigue and a vitamin D level was 16.5.  Completed 12 weeks of high-dose once weekly 50K vitamin D in July.  Now taking OTC- unknown dose. Prefers the once weekly     03/14/2023    8:56 AM 03/08/2022    9:15 AM 03/07/2021    1:02 PM 10/21/2020    1:18 PM  Depression screen PHQ 2/9  Decreased Interest 0 0 0 0  Down, Depressed, Hopeless 0 0 0 0  PHQ - 2 Score 0 0 0 0  Altered sleeping  0    Tired, decreased energy  1    Change in appetite  0    Feeling bad or failure about  yourself   0    Trouble concentrating  0    Moving slowly or fidgety/restless  0    Suicidal thoughts  0    PHQ-9 Score  1        03/08/2022    9:15 AM  GAD 7 : Generalized Anxiety Score  Nervous, Anxious, on Edge 0  Control/stop worrying 0  Worry too much - different things 1  Trouble relaxing 0  Restless 0  Easily annoyed or irritable 1  Afraid - awful might happen 0  Total GAD 7 Score 2    Immunization History  Administered Date(s) Administered   PFIZER(Purple Top)SARS-COV-2 Vaccination 04/07/2020, 08/12/2020, 09/07/2020   Tdap 06/17/2015   Past Medical History:  Diagnosis Date   Fibroids    With myomectomy   Heartburn    Hyperlipidemia    diet contolled - no meds   Hypothyroidism    Lumbar herniated disc 02/08/2011   PCOS (polycystic ovarian syndrome)    Tachycardia    Allergies  Allergen Reactions   Shellfish Allergy Nausea Only   Latex Itching   Past Surgical History:  Procedure Laterality Date   DILATION AND EVACUATION N/A 10/08/2014   Procedure: DILATATION AND EVACUATION;  Surgeon: Mitchel Honour, DO;  Location: [facility];  Service: Gynecology;  Laterality: N/A;   MYOMECTOMY  2011   abd   Family History  Problem Relation Age of Onset   Hypertension Mother  Uterine cancer Mother    Early death Mother    Miscarriages / Stillbirths Mother    Diabetes Father    Hypertension Father    Heart disease Father    Prostate cancer Father    Aneurysm Father    Breast cancer Maternal Aunt 34   Arthritis Maternal Grandmother    Diabetes Maternal Grandmother    Hypertension Maternal Grandmother    Diabetes Paternal Grandmother    Alzheimer's disease Paternal Grandmother    Asthma Son    Social History   Social History Narrative   Marital status/children/pets: Married   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:Yes     - wears seatbelt: Yes     - Feels safe in their relationships: Yes     Allergies as of 03/14/2023       Reactions   Shellfish Allergy Nausea Only   Latex Itching        Medication List        Accurate as of March 14, 2023  8:58 AM. If you have any questions, ask your nurse or doctor.          STOP taking these medications    Vitamin D (Ergocalciferol) 1.25 MG (50000 UNIT) Caps capsule Commonly known as: DRISDOL Stopped by: Felix Pacini       TAKE these medications    B-12 5000 MCG Subl Place under the tongue.   levothyroxine 125 MCG tablet Commonly known as: Synthroid Take 1 tablet (125 mcg total) by mouth daily before breakfast.   multivitamin tablet Take 1 tablet by mouth daily.        All past medical history, surgical history, allergies, family history, immunizations andmedications were updated in the EMR today and reviewed under the history and medication portions of their EMR.     No results found for this or any previous visit (from the past 2160 hour(s)).  ECHOCARDIOGRAM COMPLETE Result Date: 02/16/2021 IMPRESSIONS   1. Left ventricular ejection fraction, by estimation, is 60 to 65%. The left ventricle has normal function. The left ventricle has no regional wall motion abnormalities. Left ventricular diastolic parameters were normal.   2. Right ventricular systolic function is normal. The right ventricular size is normal.   3. The mitral valve is normal in structure. Mild mitral valve regurgitation. No evidence of mitral stenosis.   4. The aortic valve is normal in structure. Aortic valve regurgitation is not visualized. No aortic stenosis is present.   5. The inferior vena cava is normal in size with greater than 50% respiratory variability, suggesting right atrial pressure of 3 mmHg.   ROS 14 pt review of systems performed and negative (unless mentioned in an HPI)  Objective: BP 130/84   Pulse 66   Temp 98 F (36.7 C)   Ht 5\' 3"  (1.6 m)   Wt 243 lb (110.2 kg)   LMP 03/11/2023   SpO2 100%   BMI 43.05  kg/m  Physical Exam Vitals and nursing note reviewed.  Constitutional:      General: She is not in acute distress.    Appearance: Normal appearance. She is not ill-appearing or toxic-appearing.  HENT:     Head: Normocephalic and atraumatic.     Right Ear: Tympanic membrane, ear canal and external ear normal. There is no impacted cerumen.     Left Ear: Tympanic membrane, ear canal and external ear normal. There is  no impacted cerumen.     Nose: No congestion or rhinorrhea.     Mouth/Throat:     Mouth: Mucous membranes are moist.     Pharynx: Oropharynx is clear. No oropharyngeal exudate or posterior oropharyngeal erythema.  Eyes:     General: No scleral icterus.       Right eye: No discharge.        Left eye: No discharge.     Extraocular Movements: Extraocular movements intact.     Conjunctiva/sclera: Conjunctivae normal.     Pupils: Pupils are equal, round, and reactive to light.  Cardiovascular:     Rate and Rhythm: Normal rate and regular rhythm.     Pulses: Normal pulses.     Heart sounds: Normal heart sounds. No murmur heard.    No friction rub. No gallop.  Pulmonary:     Effort: Pulmonary effort is normal. No respiratory distress.     Breath sounds: Normal breath sounds. No stridor. No wheezing, rhonchi or rales.  Chest:     Chest wall: No tenderness.  Abdominal:     General: Abdomen is flat. Bowel sounds are normal. There is no distension.     Palpations: Abdomen is soft. There is no mass.     Tenderness: There is no abdominal tenderness. There is no right CVA tenderness, left CVA tenderness, guarding or rebound.     Hernia: No hernia is present.  Musculoskeletal:        General: No swelling, tenderness or deformity. Normal range of motion.     Cervical back: Normal range of motion and neck supple. No rigidity or tenderness.     Right lower leg: No edema.     Left lower leg: No edema.  Lymphadenopathy:     Cervical: No cervical adenopathy.  Skin:    General: Skin  is warm and dry.     Coloration: Skin is not jaundiced or pale.     Findings: No bruising, erythema, lesion or rash.  Neurological:     General: No focal deficit present.     Mental Status: She is alert and oriented to person, place, and time. Mental status is at baseline.     Cranial Nerves: No cranial nerve deficit.     Sensory: No sensory deficit.     Motor: No weakness.     Coordination: Coordination normal.     Gait: Gait normal.     Deep Tendon Reflexes: Reflexes normal.  Psychiatric:        Mood and Affect: Mood normal.        Behavior: Behavior normal.        Thought Content: Thought content normal.        Judgment: Judgment normal.      No results found.  Assessment/plan: Suzanne Beasley is a 52 y.o. female present for CPE and chronic condition management combined appointment Hypothyroidism due to Hashimoto's thyroiditis Continue levo 125 mcg for now. refills will be provided in appropriate dose based on lab result today TSH and T4 free collected today  Vitamin D deficiency: Supplementing OTC now, unknown dose. She would like to stay on the once weekly if possible. Vitamin D levels collected today  Mixed hyperlipidemia/Obesity (BMI 30-39.9)/Family history of heart disease Diet and exercise modifications recommended Lipid panel collected today - Hemoglobin A1c - Lipid panel  Need for immunization against influenza declined Breast cancer screening by mammogram Scheduled for December Diabetes mellitus screening - Hemoglobin A1c  Routine general medical examination at a  health care facility Patient was encouraged to exercise greater than 150 minutes a week. Patient was encouraged to choose a diet filled with fresh fruits and vegetables, and lean meats. AVS provided to patient today for education/recommendation on gender specific health and safety maintenance. Colonoscopy: Cologuard negative 11/2020- rpt 3 yrs.  Mammogram: completed: 05/05/2022- [facility].  Ordered for  her today. Cervical cancer screening: last pap: 2021- gyn Immunizations: tdap UTD 2017, Influenza decliend (encouraged yearly), discussed Shingrix series to start after birthday by nurse visit if desired for series of 2. Infectious disease screening: HIV and Hep C completed DEXA:routine screen  Return in about 1 year (around 03/14/2024) for cpe (20 min).  Orders Placed This Encounter  Procedures   Comprehensive metabolic panel   Hemoglobin A1c   TSH   Lipid panel   CBC   Vitamin D (25 hydroxy)   T4, free   No orders of the defined types were placed in this encounter.  Referral Orders  No referral(s) requested today     Electronically signed by: Felix Pacini, DO [facility]

## 2023-03-19 ENCOUNTER — Other Ambulatory Visit: Payer: Self-pay

## 2023-03-19 ENCOUNTER — Other Ambulatory Visit: Payer: Self-pay | Admitting: Family Medicine

## 2023-03-19 DIAGNOSIS — E039 Hypothyroidism, unspecified: Secondary | ICD-10-CM

## 2023-03-19 MED ORDER — VITAMIN D (ERGOCALCIFEROL) 1.25 MG (50000 UNIT) PO CAPS: 50000.0000 [IU] | ORAL_CAPSULE | ORAL | 3 refills | Status: DC

## 2023-03-19 MED ORDER — LEVOTHYROXINE SODIUM 112 MCG PO TABS: 112.0000 ug | ORAL_TABLET | Freq: Every day | ORAL | 3 refills | Status: DC

## 2023-04-16 ENCOUNTER — Other Ambulatory Visit: Payer: Self-pay | Admitting: Family Medicine

## 2023-04-16 DIAGNOSIS — Z1231 Encounter for screening mammogram for malignant neoplasm of breast: Secondary | ICD-10-CM

## 2023-05-17 ENCOUNTER — Ambulatory Visit
Admission: RE | Admit: 2023-05-17 | Discharge: 2023-05-17 | Disposition: A | Discharge status: Home / Self Care | Payer: 59 | Source: Ambulatory Visit | Attending: Family Medicine | Admitting: Family Medicine

## 2023-05-17 DIAGNOSIS — Z1231 Encounter for screening mammogram for malignant neoplasm of breast: Secondary | ICD-10-CM

## 2023-05-31 ENCOUNTER — Ambulatory Visit: Payer: 59 | Admitting: Radiology

## 2023-05-31 ENCOUNTER — Encounter: Payer: Self-pay | Admitting: Radiology

## 2023-05-31 VITALS — BP 120/82 | HR 79 | Ht 62.75 in | Wt 247.0 lb

## 2023-05-31 DIAGNOSIS — Z01419 Encounter for gynecological examination (general) (routine) without abnormal findings: Secondary | ICD-10-CM | POA: Diagnosis not present

## 2023-05-31 NOTE — Patient Instructions (Signed)
Preventive Care 40-50 Years Old, Female Preventive care refers to lifestyle choices and visits with your health care provider that can promote health and wellness. Preventive care visits are also called wellness exams. What can I expect for my preventive care visit? Counseling Your health care provider may ask you questions about your: Medical history, including: Past medical problems. Family medical history. Pregnancy history. Current health, including: Menstrual cycle. Method of birth control. Emotional well-being. Home life and relationship well-being. Sexual activity and sexual health. Lifestyle, including: Alcohol, nicotine or tobacco, and drug use. Access to firearms. Diet, exercise, and sleep habits. Work and work environment. Sunscreen use. Safety issues such as seatbelt and bike helmet use. Physical exam Your health care provider will check your: Height and weight. These may be used to calculate your BMI (body mass index). BMI is a measurement that tells if you are at a healthy weight. Waist circumference. This measures the distance around your waistline. This measurement also tells if you are at a healthy weight and may help predict your risk of certain diseases, such as type 2 diabetes and high blood pressure. Heart rate and blood pressure. Body temperature. Skin for abnormal spots. What immunizations do I need?  Vaccines are usually given at various ages, according to a schedule. Your health care provider will recommend vaccines for you based on your age, medical history, and lifestyle or other factors, such as travel or where you work. What tests do I need? Screening Your health care provider may recommend screening tests for certain conditions. This may include: Lipid and cholesterol levels. Diabetes screening. This is done by checking your blood sugar (glucose) after you have not eaten for a while (fasting). Pelvic exam and Pap test. Hepatitis B test. Hepatitis C  test. HIV (human immunodeficiency virus) test. STI (sexually transmitted infection) testing, if you are at risk. Lung cancer screening. Colorectal cancer screening. Mammogram. Talk with your health care provider about when you should start having regular mammograms. This may depend on whether you have a family history of breast cancer. BRCA-related cancer screening. This may be done if you have a family history of breast, ovarian, tubal, or peritoneal cancers. Bone density scan. This is done to screen for osteoporosis. Talk with your health care provider about your test results, treatment options, and if necessary, the need for more tests. Follow these instructions at home: Eating and drinking  Eat a diet that includes fresh fruits and vegetables, whole grains, lean protein, and low-fat dairy products. Take vitamin and mineral supplements as recommended by your health care provider. Do not drink alcohol if: Your health care provider tells you not to drink. You are pregnant, may be pregnant, or are planning to become pregnant. If you drink alcohol: Limit how much you have to 0-1 drink a day. Know how much alcohol is in your drink. In the [place] one drink equals one 12 oz bottle of beer (355 mL), one 5 oz glass of wine (148 mL), or one 1 oz glass of hard liquor (44 mL). Lifestyle Brush your teeth every morning and night with fluoride toothpaste. Floss one time each day. Exercise for at least 30 minutes 5 or more days each week. Do not use any products that contain nicotine or tobacco. These products include cigarettes, chewing tobacco, and vaping devices, such as e-cigarettes. If you need help quitting, ask your health care provider. Do not use drugs. If you are sexually active, practice safe sex. Use a condom or other form of protection to   prevent STIs. If you do not wish to become pregnant, use a form of birth control. If you plan to become pregnant, see your health care provider for a  prepregnancy visit. Take aspirin only as told by your health care provider. Make sure that you understand how much to take and what form to take. Work with your health care provider to find out whether it is safe and beneficial for you to take aspirin daily. Find healthy ways to manage stress, such as: Meditation, yoga, or listening to music. Journaling. Talking to a trusted person. Spending time with friends and family. Minimize exposure to UV radiation to reduce your risk of skin cancer. Safety Always wear your seat belt while driving or riding in a vehicle. Do not drive: If you have been drinking alcohol. Do not ride with someone who has been drinking. When you are tired or distracted. While texting. If you have been using any mind-altering substances or drugs. Wear a helmet and other protective equipment during sports activities. If you have firearms in your house, make sure you follow all gun safety procedures. Seek help if you have been physically or sexually abused. What's next? Visit your health care provider once a year for an annual wellness visit. Ask your health care provider how often you should have your eyes and teeth checked. Stay up to date on all vaccines. This information is not intended to replace advice given to you by your health care provider. Make sure you discuss any questions you have with your health care provider. Document Revised: 11/17/2020 Document Reviewed: 11/17/2020 Elsevier Patient Education  2024 Elsevier Inc.  

## 2023-05-31 NOTE — Progress Notes (Signed)
Suzanne Beasley 05/26/1971 [id number]   History:  52 y.o. G1P0 presents for annual exam. No new gyn concerns. Has a PCP. Questions regarding shingles vaccine and tdap. Periods still regular with cramping. Otherwise doing well.   Gynecologic History Patient's last menstrual period was 05/27/2023 (exact date). Period Cycle (Days): 28 Period Duration (Days): 6 Period Pattern: Regular Menstrual Flow: Moderate, Light, Heavy Menstrual Control: Thin pad, Maxi pad Dysmenorrhea: (!) Severe Dysmenorrhea Symptoms: Cramping Contraception/Family planning: none Sexually active: yes Last Pap: 2023. Results were: normal Last mammogram: 05/2023. Results were: normal Cologuard: 2022  Obstetric History OB History  Gravida Para Term Preterm AB Living  1 0   1 0  SAB IAB Ectopic Multiple Live Births  1        # Outcome Date GA Lbr Len/2nd Weight Sex Type Anes PTL Lv  1 SAB             The following portions of the patient's history were reviewed and updated as appropriate: allergies, current medications, past family history, past medical history, past social history, past surgical history, and problem list.  Review of Systems  All other systems reviewed and are negative.   Past medical history, past surgical history, family history and social history were all reviewed and documented in the EPIC chart.  Exam:  Vitals:   05/31/23 1136  BP: 120/82  Pulse: 79  SpO2: 94%  Weight: 247 lb (112 kg)  Height: 5' 2.75" (1.594 m)   Body mass index is 44.1 kg/m.  Physical Exam Vitals and nursing note reviewed. Exam conducted with a chaperone present.  Constitutional:      Appearance: Normal appearance. She is obese.  HENT:     Head: Normocephalic and atraumatic.  Neck:     Thyroid: No thyroid mass, thyromegaly or thyroid tenderness.  Cardiovascular:     Rate and Rhythm: Regular rhythm.     Heart sounds: Normal heart sounds.  Pulmonary:     Effort: Pulmonary effort is normal.      Breath sounds: Normal breath sounds.  Chest:  Breasts:    Breasts are symmetrical.     Right: Normal. No inverted nipple, mass, nipple discharge, skin change or tenderness.     Left: Normal. No inverted nipple, mass, nipple discharge, skin change or tenderness.  Abdominal:     General: Abdomen is flat. Bowel sounds are normal.     Palpations: Abdomen is soft.  Genitourinary:    General: Normal vulva.     Vagina: Normal. No vaginal discharge, bleeding or lesions.     Cervix: Normal. No discharge or lesion.     Uterus: Normal. Not enlarged and not tender.      Adnexa: Right adnexa normal and left adnexa normal.       Right: No mass, tenderness or fullness.         Left: No mass, tenderness or fullness.    Lymphadenopathy:     Upper Body:     Right upper body: No axillary adenopathy.     Left upper body: No axillary adenopathy.  Skin:    General: Skin is warm and dry.  Neurological:     Mental Status: She is alert and oriented to person, place, and time.  Psychiatric:        Mood and Affect: Mood normal.        Thought Content: Thought content normal.        Judgment: Judgment normal.      Raynelle Fanning,  CMA present for exam  Assessment/Plan:   1. Well woman exam with routine gynecological exam (Primary) Pap 2026 Yearly mammogram Cologuard 2025 Labs with PCP   Return in about 1 year (around 05/30/2024) for Annual.  Arlie Solomons B [facility] 12:44 PM 05/31/2023

## 2023-10-04 ENCOUNTER — Ambulatory Visit: Admitting: Family Medicine

## 2023-10-04 VITALS — BP 122/80 | HR 69 | Temp 97.9°F | Ht 63.6 in | Wt 246.8 lb

## 2023-10-04 DIAGNOSIS — E559 Vitamin D deficiency, unspecified: Secondary | ICD-10-CM | POA: Diagnosis not present

## 2023-10-04 DIAGNOSIS — E063 Autoimmune thyroiditis: Secondary | ICD-10-CM

## 2023-10-04 DIAGNOSIS — R5383 Other fatigue: Secondary | ICD-10-CM | POA: Diagnosis not present

## 2023-10-04 LAB — VITAMIN D 25 HYDROXY (VIT D DEFICIENCY, FRACTURES): VITD: 36.92 ng/mL (ref 30.00–100.00)

## 2023-10-04 LAB — T4, FREE: Free T4: 0.98 ng/dL (ref 0.60–1.60)

## 2023-10-04 LAB — T3, FREE: T3, Free: 3.5 pg/mL (ref 2.3–4.2)

## 2023-10-04 LAB — B12 AND FOLATE PANEL
Folate: 19.9 ng/mL (ref >5.9)
Vitamin B-12: 270 pg/mL (ref 211–911)

## 2023-10-04 LAB — TSH: TSH: 0.99 u[IU]/mL (ref 0.35–5.50)

## 2023-10-04 NOTE — Patient Instructions (Signed)
 Align is a good probiotic.  Provitalize probiotic seems good by patient reports.   [facility] we spoke of .   * If scheduling here for weight loss help If you are interested in weight loss counseling please make appt to discuss and bring with you 2 weeks of a food diary/log on notebook paper.  Food log is mandatory on first appt to proceed with counseling.  Weight loss counseling encompasses diet, exercise and can include  medications when appropriate and affordable.  There are routine appts for check-ins and weights to track progress and keep you on track.  Routine check-ins (in person) are also mandatory to continue with prescription refills. Check-in timeline  can range from 4 weeks to 12 weeks, depending on physician's recommendations and which step you are in of your weight loss journey.  Your BMI today is Body mass index is 42.9 kg/m.     Return if symptoms worsen or fail to improve.        Great to see you today.  I have refilled the medication(s) we provide.   If labs were collected or images ordered, we will inform you of  results once we have received them and reviewed. We will contact you either by echart message, or telephone call.  Please give ample time to the testing facility, and our office to run,  receive and review results. Please do not call inquiring of results, even if you can see them in your chart. We will contact you as soon as we are able. If it has been over 1 week since the test was completed, and you have not yet heard from us , then please call us .    - echart message- for normal results that have been seen by the patient already.   - telephone call: abnormal results or if patient has not viewed results in their echart.  If a referral to a specialist was entered for you, please call us  in 2 weeks if you have not heard from the specialist office to schedule.

## 2023-10-04 NOTE — Progress Notes (Unsigned)
 Patient ID: Suzanne Beasley, female  DOB: 1970-11-07, 53 y.o.   MRN: [id number] Patient Care Team    Relationship Specialty Notifications Start End  Mariel Shope, DO PCP - General Family Medicine  10/20/20   Tobb, Kardie, DO PCP - Cardiology Cardiology  02/09/21   Andee Bamberger, NP Nurse Practitioner Gynecology  10/21/20     Chief Complaint  Patient presents with   Hypothyroidism    Subjective:  Suzanne Beasley is a 53 y.o.  Female  present for hypothyroidism All past medical history, surgical history, allergies, family history, immunizations, medications and social history were updated in the electronic medical record today. All recent labs, ED visits and hospitalizations within the last year were reviewed.   Hypothyroidism/fatigue: Patient reports compliance with levo 125mcg qd. > 112 mcg. Feels her eye is "jumping", spasms, and in the past that has been an indicator of her thyroid  being off.    Vitamin D  deficiency: Diagnosed 09/29/2022 with symptoms of fatigue and a vitamin D  level was 16.5.  Completed 12 weeks of high-dose once weekly 50K vitamin D  in July.  Now taking OTC- unknown dose. Prefers the once weekly     05/31/2023   11:34 AM 03/14/2023    8:56 AM 03/08/2022    9:15 AM 03/07/2021    1:02 PM 10/21/2020    1:18 PM  Depression screen PHQ 2/9  Decreased Interest 0 0 0 0 0  Down, Depressed, Hopeless 0 0 0 0 0  PHQ - 2 Score 0 0 0 0 0  Altered sleeping   0    Tired, decreased energy   1    Change in appetite   0    Feeling bad or failure about yourself    0    Trouble concentrating   0    Moving slowly or fidgety/restless   0    Suicidal thoughts   0    PHQ-9 Score   1        03/08/2022    9:15 AM  GAD 7 : Generalized Anxiety Score  Nervous, Anxious, on Edge 0  Control/stop worrying 0  Worry too much - different things 1  Trouble relaxing 0  Restless 0  Easily annoyed or irritable 1  Afraid - awful might happen 0  Total GAD 7 Score 2     Immunization History  Administered Date(s) Administered   PFIZER(Purple Top)SARS-COV-2 Vaccination 04/07/2020, 08/12/2020, 09/07/2020   Tdap 06/17/2015   Past Medical History:  Diagnosis Date   Fibroids    With myomectomy   Heartburn    Hyperlipidemia    diet contolled - no meds   Hypothyroidism    Lumbar herniated disc 02/08/2011   PCOS (polycystic ovarian syndrome)    Seasonal allergies    Tachycardia    Allergies  Allergen Reactions   Shellfish Allergy Nausea Only   Latex Itching   Past Surgical History:  Procedure Laterality Date   DILATION AND EVACUATION N/A 10/08/2014   Procedure: DILATATION AND EVACUATION;  Surgeon: Dyanna Glasgow, DO;  Location: [facility];  Service: Gynecology;  Laterality: N/A;   MYOMECTOMY  2011   abd   Family History  Problem Relation Age of Onset   Hypertension Mother    Uterine cancer Mother    Early death Mother    Miscarriages / Stillbirths Mother    Diabetes Father    Hypertension Father    Heart disease Father    Prostate cancer Father  Aneurysm Father    Breast cancer Maternal Aunt 63   Arthritis Maternal Grandmother    Diabetes Maternal Grandmother    Hypertension Maternal Grandmother    Diabetes Paternal Grandmother    Alzheimer's disease Paternal Grandmother    Asthma Son    Social History   Social History Narrative   Marital status/children/pets: Married   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:Yes     - wears seatbelt: Yes     - Feels safe in their relationships: Yes    Allergies as of 10/04/2023       Reactions   Shellfish Allergy Nausea Only   Latex Itching        Medication List        Accurate as of Oct 04, 2023  8:34 AM. If you have any questions, ask your nurse or doctor.          augmented betamethasone dipropionate 0.05 % ointment Commonly known as: DIPROLENE-AF PLEASE SEE ATTACHED FOR DETAILED DIRECTIONS    levothyroxine  112 MCG tablet Commonly known as: Synthroid  Take 1 tablet (112 mcg total) by mouth daily before breakfast.   multivitamin tablet Take 1 tablet by mouth daily.   Vitamin D  (Ergocalciferol ) 1.25 MG (50000 UNIT) Caps capsule Commonly known as: DRISDOL  Take 1 capsule (50,000 Units total) by mouth every 7 (seven) days.        All past medical history, surgical history, allergies, family history, immunizations andmedications were updated in the EMR today and reviewed under the history and medication portions of their EMR.     No results found for this or any previous visit (from the past 2160 hours).  ECHOCARDIOGRAM COMPLETE Result Date: 02/16/2021 IMPRESSIONS   1. Left ventricular ejection fraction, by estimation, is 60 to 65%. The left ventricle has normal function. The left ventricle has no regional wall motion abnormalities. Left ventricular diastolic parameters were normal.   2. Right ventricular systolic function is normal. The right ventricular size is normal.   3. The mitral valve is normal in structure. Mild mitral valve regurgitation. No evidence of mitral stenosis.   4. The aortic valve is normal in structure. Aortic valve regurgitation is not visualized. No aortic stenosis is present.   5. The inferior vena cava is normal in size with greater than 50% respiratory variability, suggesting right atrial pressure of 3 mmHg.   ROS 14 pt review of systems performed and negative (unless mentioned in an HPI)  Objective: BP 122/80   Pulse 69   Temp 97.9 F (36.6 C)   Ht 5' 3.6" (1.615 m)   Wt 246 lb 12.8 oz (111.9 kg)   LMP 09/30/2023   SpO2 98%   BMI 42.90 kg/m  Physical Exam Vitals and nursing note reviewed.  Constitutional:      General: She is not in acute distress.    Appearance: Normal appearance. She is not ill-appearing, toxic-appearing or diaphoretic.  HENT:     Head: Normocephalic and atraumatic.  Eyes:     General: No scleral icterus.        Right eye: No discharge.        Left eye: No discharge.     Extraocular Movements: Extraocular movements intact.     Conjunctiva/sclera: Conjunctivae normal.     Pupils: Pupils are equal, round, and reactive to light.  Cardiovascular:     Rate and Rhythm: Normal rate and regular rhythm.  Pulmonary:     Effort: Pulmonary effort is normal. No respiratory distress.     Breath sounds: Normal breath sounds. No wheezing, rhonchi or rales.  Musculoskeletal:     Right lower leg: No edema.     Left lower leg: No edema.  Skin:    General: Skin is warm.     Findings: No rash.  Neurological:     Mental Status: She is alert and oriented to person, place, and time. Mental status is at baseline.     Motor: No weakness.     Gait: Gait normal.  Psychiatric:        Mood and Affect: Mood normal.        Behavior: Behavior normal.        Thought Content: Thought content normal.        Judgment: Judgment normal.      No results found.  Assessment/plan: Suzanne Beasley is a 53 y.o. female present for  chronic condition management  Hypothyroidism due to Hashimoto's thyroiditis Continue levo 112  mcg for now. refills will be provided in appropriate dose based on lab result today TSH and T4 free collected today  Fatigue: B12 collected today  Vitamin D  deficiency: Supplementing OTC now, unknown dose. She would like to stay on the once weekly if possible. Vitamin D  levels collected today  Mixed hyperlipidemia/Obesity (BMI 30-39.9)/Family history of heart disease Diet and exercise modifications recommended   No follow-ups on file.  No orders of the defined types were placed in this encounter.  No orders of the defined types were placed in this encounter.  Referral Orders  No referral(s) requested today     Electronically signed by: Napolean Backbone, DO [facility]

## 2023-10-05 ENCOUNTER — Other Ambulatory Visit: Payer: Self-pay | Admitting: Family Medicine

## 2023-10-05 ENCOUNTER — Encounter: Payer: Self-pay | Admitting: Family Medicine

## 2023-10-05 DIAGNOSIS — E039 Hypothyroidism, unspecified: Secondary | ICD-10-CM

## 2023-10-05 MED ORDER — LEVOTHYROXINE SODIUM 112 MCG PO TABS: 112.0000 ug | ORAL_TABLET | Freq: Every day | ORAL | 3 refills | Status: DC

## 2023-11-01 ENCOUNTER — Ambulatory Visit: Admitting: Family Medicine

## 2023-11-01 VITALS — BP 124/80 | HR 72 | Temp 98.1°F | Wt 250.6 lb

## 2023-11-01 DIAGNOSIS — Z8249 Family history of ischemic heart disease and other diseases of the circulatory system: Secondary | ICD-10-CM

## 2023-11-01 DIAGNOSIS — E782 Mixed hyperlipidemia: Secondary | ICD-10-CM | POA: Diagnosis not present

## 2023-11-01 DIAGNOSIS — E669 Obesity, unspecified: Secondary | ICD-10-CM

## 2023-11-01 MED ORDER — NALTREXONE HCL 50 MG PO TABS: 25.0000 mg | ORAL_TABLET | Freq: Two times a day (BID) | ORAL | 1 refills | Status: DC

## 2023-11-01 NOTE — Progress Notes (Signed)
 Suzanne Beasley , 14-May-1971, 53 y.o., female MRN: [id number] Patient Care Team    Relationship Specialty Notifications Start End  Mariel Shope, DO PCP - General Family Medicine  10/20/20   Tobb, Kardie, DO PCP - Cardiology Cardiology  02/09/21   Andee Bamberger, NP Nurse Practitioner Gynecology  10/21/20     Chief Complaint  Patient presents with   Weight Management Screening     Subjective: Suzanne Beasley is a 53 y.o. Pt presents for an OV to discuss obesity and weight loss counseling. BMI: 43.56 lbs: 250.5  Patient reports she does not follow a particular diet or count calories.  She currently is not exercising.  She drinks about 2 bottles of water a day and 3 Coke zero cans, plus a coffee in the morning.  She has started a probiotic-align.  She brings with her her food diary and which we reviewed together.  She does have healthy options in her diet, as well as high carbohydrate meals.  She does endorse having a sweet tooth and craves sugar.  She is not interested in GLP's at this time     05/31/2023   11:34 AM 03/14/2023    8:56 AM 03/08/2022    9:15 AM 03/07/2021    1:02 PM 10/21/2020    1:18 PM  Depression screen PHQ 2/9  Decreased Interest 0 0 0 0 0  Down, Depressed, Hopeless 0 0 0 0 0  PHQ - 2 Score 0 0 0 0 0  Altered sleeping   0    Tired, decreased energy   1    Change in appetite   0    Feeling bad or failure about yourself    0    Trouble concentrating   0    Moving slowly or fidgety/restless   0    Suicidal thoughts   0    PHQ-9 Score   1      Allergies  Allergen Reactions   Shellfish Allergy Nausea Only   Latex Itching   Social History   Social History Narrative   Marital status/children/pets: Married   Education/employment: College degree.  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:Yes     - wears seatbelt: Yes     - Feels safe in their relationships: Yes   Past Medical History:  Diagnosis Date    Fibroids    With myomectomy   Heartburn    Hyperlipidemia    diet contolled - no meds   Hypothyroidism    Lumbar herniated disc 02/08/2011   PCOS (polycystic ovarian syndrome)    Seasonal allergies    Tachycardia    Past Surgical History:  Procedure Laterality Date   DILATION AND EVACUATION N/A 10/08/2014   Procedure: DILATATION AND EVACUATION;  Surgeon: Dyanna Glasgow, DO;  Location: [facility];  Service: Gynecology;  Laterality: N/A;   MYOMECTOMY  2011   abd   Family History  Problem Relation Age of Onset   Hypertension Mother    Uterine cancer Mother    Early death Mother    Miscarriages / Stillbirths Mother    Diabetes Father    Hypertension Father    Heart disease Father    Prostate cancer Father    Aneurysm Father    Breast cancer Maternal Aunt 63   Arthritis Maternal Grandmother    Diabetes Maternal Grandmother    Hypertension Maternal Grandmother  Diabetes Paternal Grandmother    Alzheimer's disease Paternal Grandmother    Asthma Son    Allergies as of 11/01/2023       Reactions   Shellfish Allergy Nausea Only   Latex Itching        Medication List        Accurate as of Nov 01, 2023 11:00 AM. If you have any questions, ask your nurse or doctor.          augmented betamethasone dipropionate 0.05 % ointment Commonly known as: DIPROLENE-AF PLEASE SEE ATTACHED FOR DETAILED DIRECTIONS   levothyroxine  112 MCG tablet Commonly known as: Synthroid  Take 1 tablet (112 mcg total) by mouth daily before breakfast.   multivitamin tablet Take 1 tablet by mouth daily.   naltrexone 50 MG tablet Commonly known as: DEPADE Take 0.5 tablets (25 mg total) by mouth 2 (two) times daily with a meal.   Vitamin D  (Ergocalciferol ) 1.25 MG (50000 UNIT) Caps capsule Commonly known as: DRISDOL  Take 1 capsule (50,000 Units total) by mouth every 7 (seven) days.        All past medical history, surgical history, allergies, family history, immunizations andmedications were  updated in the EMR today and reviewed under the history and medication portions of their EMR.     ROS Negative, with the exception of above mentioned in HPI   Objective:  BP 124/80   Pulse 72   Temp 98.1 F (36.7 C)   Wt 250 lb 9.6 oz (113.7 kg)   LMP 09/30/2023   SpO2 97%   BMI 43.56 kg/m  Body mass index is 43.56 kg/m. Physical Exam Vitals and nursing note reviewed.  Constitutional:      General: She is not in acute distress.    Appearance: Normal appearance. She is normal weight. She is not ill-appearing or toxic-appearing.  HENT:     Head: Normocephalic and atraumatic.  Eyes:     General: No scleral icterus.       Right eye: No discharge.        Left eye: No discharge.     Extraocular Movements: Extraocular movements intact.     Conjunctiva/sclera: Conjunctivae normal.     Pupils: Pupils are equal, round, and reactive to light.  Skin:    Findings: No rash.  Neurological:     Mental Status: She is alert and oriented to person, place, and time. Mental status is at baseline.     Motor: No weakness.     Coordination: Coordination normal.     Gait: Gait normal.  Psychiatric:        Mood and Affect: Mood normal.        Behavior: Behavior normal.        Thought Content: Thought content normal.        Judgment: Judgment normal.      No results found. No results found. No results found for this or any previous visit (from the past 24 hours).  Assessment/Plan: Suzanne Beasley is a 53 y.o. female present for OV for  Obesity (BMI 30-39.9) (Primary)/hyperlipidemia/family history of heart disease Patient was counseled on exercise, calorie counting, weight loss and potential medications to help with weight loss today. -Patient was provided with online resources for: Weekly net calorie calculator.  Applications for calorie counting.  Patient was advised to ensure she is taking in adequate nutrition daily by meeting calorie goals. -Patient was educated on dietary changes to  not only lose weight but to eat healthy.  Patient was  educated on glycemic index. -Patient was educated on exercise goal of 150 minutes a week (plus warm up and cool down) of cardiovascular exercise.  Patient was educated on heart rate for cardiovascular and fat burning zones. -Patient was encouraged to maintain adequate water consumption of at least 80 ounces a day, more if exercising/sweating. Goals: -Increase water, cut back on soda -Start exercise regimen/cardiovascular workout at least 3 times a week 30 minutes each, working way up to 150 minutes a week minimum. -Low glycemic index diet -Calorie counting 1300-1400 cal to start -Continue probiotic -Depade half tab p.o. twice daily with meals  Follow-up 6 weeks  Reviewed expectations re: course of current medical issues. Discussed self-management of symptoms. Outlined signs and symptoms indicating need for more acute intervention. Patient verbalized understanding and all questions were answered. Patient received an After-Visit Summary.    No orders of the defined types were placed in this encounter.  Meds ordered this encounter  Medications   naltrexone (DEPADE) 50 MG tablet    Sig: Take 0.5 tablets (25 mg total) by mouth 2 (two) times daily with a meal.    Dispense:  90 tablet    Refill:  1   Referral Orders  No referral(s) requested today     Note is dictated utilizing voice recognition software. Although note has been proof read prior to signing, occasional typographical errors still can be missed. If any questions arise, please do not hesitate to call for verification.   electronically signed by:  Napolean Backbone, DO  [facility] [place]

## 2023-11-01 NOTE — Patient Instructions (Addendum)
   Weekly net calorie calculator.   Applications for calorie counting.   glycemic index. exercise goal of 150 minutes a week (plus warm up and cool down) of cardiovascular exercise.   adequate water consumption of at least 100 ounces a day  Exercise and healthy eating are a must to reach your goals.  You will achieve your goals if you commit to your health 100%. No shortcuts.    - Being and feeling overweight/obese is tough.    - Achieving your healthy body is tough.        - You get to choose your "tough."    You CAN do this.    Return in about 6 weeks (around 12/13/2023) for weight loss/obesity #2.        Great to see you today.  I have refilled the medication(s) we provide.   If labs were collected or images ordered, we will inform you of  results once we have received them and reviewed. We will contact you either by echart message, or telephone call.  Please give ample time to the testing facility, and our office to run,  receive and review results. Please do not call inquiring of results, even if you can see them in your chart. We will contact you as soon as we are able. If it has been over 1 week since the test was completed, and you have not yet heard from us , then please call us .    - echart message- for normal results that have been seen by the patient already.   - telephone call: abnormal results or if patient has not viewed results in their echart.  If a referral to a specialist was entered for you, please call us  in 2 weeks if you have not heard from the specialist office to schedule.

## 2024-03-21 ENCOUNTER — Encounter: Payer: Self-pay | Admitting: Family Medicine

## 2024-03-21 ENCOUNTER — Ambulatory Visit: Payer: Self-pay | Admitting: Family Medicine

## 2024-03-21 VITALS — BP 122/80 | HR 77 | Temp 98.2°F | Ht 63.0 in | Wt 244.0 lb

## 2024-03-21 DIAGNOSIS — Z1211 Encounter for screening for malignant neoplasm of colon: Secondary | ICD-10-CM | POA: Diagnosis not present

## 2024-03-21 DIAGNOSIS — E538 Deficiency of other specified B group vitamins: Secondary | ICD-10-CM

## 2024-03-21 DIAGNOSIS — E063 Autoimmune thyroiditis: Secondary | ICD-10-CM | POA: Diagnosis not present

## 2024-03-21 DIAGNOSIS — Z8249 Family history of ischemic heart disease and other diseases of the circulatory system: Secondary | ICD-10-CM | POA: Diagnosis not present

## 2024-03-21 DIAGNOSIS — E669 Obesity, unspecified: Secondary | ICD-10-CM | POA: Diagnosis not present

## 2024-03-21 DIAGNOSIS — E559 Vitamin D deficiency, unspecified: Secondary | ICD-10-CM | POA: Diagnosis not present

## 2024-03-21 DIAGNOSIS — Z0000 Encounter for general adult medical examination without abnormal findings: Secondary | ICD-10-CM

## 2024-03-21 DIAGNOSIS — Z131 Encounter for screening for diabetes mellitus: Secondary | ICD-10-CM

## 2024-03-21 DIAGNOSIS — Z23 Encounter for immunization: Secondary | ICD-10-CM

## 2024-03-21 DIAGNOSIS — E782 Mixed hyperlipidemia: Secondary | ICD-10-CM

## 2024-03-21 DIAGNOSIS — Z1231 Encounter for screening mammogram for malignant neoplasm of breast: Secondary | ICD-10-CM

## 2024-03-21 LAB — COMPREHENSIVE METABOLIC PANEL WITH GFR
ALT: 10 U/L (ref 0–35)
AST: 12 U/L (ref 0–37)
Albumin: 3.9 g/dL (ref 3.5–5.2)
Alkaline Phosphatase: 44 U/L (ref 39–117)
BUN: 13 mg/dL (ref 6–23)
CO2: 26 meq/L (ref 19–32)
Calcium: 8.8 mg/dL (ref 8.4–10.5)
Chloride: 106 meq/L (ref 96–112)
Creatinine, Ser: 1.24 mg/dL — ABNORMAL HIGH (ref 0.40–1.20)
GFR: 50.58 mL/min — ABNORMAL LOW (ref >60.00)
Glucose, Bld: 111 mg/dL — ABNORMAL HIGH (ref 70–99)
Potassium: 4.1 meq/L (ref 3.5–5.1)
Sodium: 138 meq/L (ref 135–145)
Total Bilirubin: 0.6 mg/dL (ref 0.2–1.2)
Total Protein: 6.8 g/dL (ref 6.0–8.3)

## 2024-03-21 LAB — CBC
HCT: 39.8 % (ref 36.0–46.0)
Hemoglobin: 13 g/dL (ref 12.0–15.0)
MCHC: 32.8 g/dL (ref 30.0–36.0)
MCV: 88.2 fl (ref 78.0–100.0)
Platelets: 224 K/uL (ref 150.0–400.0)
RBC: 4.51 Mil/uL (ref 3.87–5.11)
RDW: 13.2 % (ref 11.5–15.5)
WBC: 4.9 K/uL (ref 4.0–10.5)

## 2024-03-21 LAB — LIPID PANEL
Cholesterol: 224 mg/dL — ABNORMAL HIGH (ref 0–200)
HDL: 56.5 mg/dL (ref >39.00)
LDL Cholesterol: 152 mg/dL — ABNORMAL HIGH (ref 0–99)
NonHDL: 167.98
Total CHOL/HDL Ratio: 4
Triglycerides: 80 mg/dL (ref 0.0–149.0)
VLDL: 16 mg/dL (ref 0.0–40.0)

## 2024-03-21 LAB — VITAMIN D 25 HYDROXY (VIT D DEFICIENCY, FRACTURES): VITD: 42.88 ng/mL (ref 30.00–100.00)

## 2024-03-21 LAB — HEMOGLOBIN A1C: Hgb A1c MFr Bld: 5.9 % (ref 4.6–6.5)

## 2024-03-21 LAB — VITAMIN B12: Vitamin B-12: 459 pg/mL (ref 211–911)

## 2024-03-21 LAB — TSH: TSH: 3.45 u[IU]/mL (ref 0.35–5.50)

## 2024-03-21 MED ORDER — VITAMIN D (ERGOCALCIFEROL) 1.25 MG (50000 UNIT) PO CAPS: 50000.0000 [IU] | ORAL_CAPSULE | ORAL | 3 refills | Status: AC

## 2024-03-21 NOTE — Patient Instructions (Addendum)
 Return in about 1 year (around 03/22/2025) for cpe (20 min), Routine chronic condition follow-up.  And nurse visit 3-4 months for shingrix No.2      Great to see you today.  I have refilled the medication(s) we provide.   If labs were collected or images ordered, we will inform you of  results once we have received them and reviewed. We will contact you either by echart message, or telephone call.  Please give ample time to the testing facility, and our office to run,  receive and review results. Please do not call inquiring of results, even if you can see them in your chart. We will contact you as soon as we are able. If it has been over 1 week since the test was completed, and you have not yet heard from us , then please call us .    - echart message- for normal results that have been seen by the patient already.   - telephone call: abnormal results or if patient has not viewed results in their echart.  If a referral to a specialist was entered for you, please call us  in 2 weeks if you have not heard from the specialist office to schedule.

## 2024-03-21 NOTE — Progress Notes (Signed)
 Patient ID: Suzanne Beasley, female  DOB: Jan 13, 1971, 53 y.o.   MRN: [id number] Patient Care Team    Relationship Specialty Notifications Start End  Catherine Charlies LABOR, DO PCP - General Family Medicine  10/20/20   Prentiss Annabella LABOR, NP Nurse Practitioner Gynecology  10/21/20   Tobb, Kardie, DO Consulting Physician Cardiology  02/09/21     Chief Complaint  Patient presents with   Annual Exam    Pt is fasting Chronic condition management Shingrix vaccine Prevnar 20 vaccine Influenza vaccine    Subjective:  Suzanne Beasley is a 53 y.o.  Female  present for annual CPE and chronic condition management appointment. All past medical history, surgical history, allergies, family history, immunizations, medications and social history were updated in the electronic medical record today. All recent labs, ED visits and hospitalizations within the last year were reviewed.  Health maintenance:  Colonoscopy: Cologuard negative 11/2020- rpt 3 yrs.> cologuard Mammogram: completed: 05/17/2023- [facility].  Ordered for her today. Cervical cancer screening: last pap: 2023- gyn Immunizations: tdap UTD 2017, Influenza - given(encouraged yearly),Shingrix no.1 given, Prevnar 20-declined Infectious disease screening: HIV and Hep C completed DEXA:routine screen to start at 60 Patient has a Dental home. Hospitalizations/ED visits: Reviewed  Hypothyroidism/fatigue: Patient reports compliance with levo 112 mcg.  No complaints  Vitamin D  deficiency/B12 deficiency: Diagnosed 09/29/2022 with symptoms of fatigue and a vitamin D  level was 16.5.  Requiring high-dose once weekly vitamin D  to be continued to maintain levels. B12 270 10/04/2023>1000 mcg supplement     05/31/2023   11:34 AM 03/14/2023    8:56 AM 03/08/2022    9:15 AM 03/07/2021    1:02 PM 10/21/2020    1:18 PM  Depression screen PHQ 2/9  Decreased Interest 0 0 0 0 0  Down, Depressed, Hopeless 0 0 0 0 0  PHQ - 2 Score 0 0 0 0 0  Altered sleeping   0     Tired, decreased energy   1    Change in appetite   0    Feeling bad or failure about yourself    0    Trouble concentrating   0    Moving slowly or fidgety/restless   0    Suicidal thoughts   0    PHQ-9 Score   1        03/08/2022    9:15 AM  GAD 7 : Generalized Anxiety Score  Nervous, Anxious, on Edge 0  Control/stop worrying 0  Worry too much - different things 1  Trouble relaxing 0  Restless 0  Easily annoyed or irritable 1  Afraid - awful might happen 0  Total GAD 7 Score 2      11/17/2022   11:14 AM 03/14/2023    8:55 AM  Fall Risk  Falls in the past year? 0 0  Was there an injury with Fall? 0 0  Fall Risk Category Calculator 0 0  Patient at Risk for Falls Due to  No Fall Risks  Fall risk Follow up Falls evaluation completed;Education provided Falls evaluation completed     Immunization History  Administered Date(s) Administered   Influenza, Seasonal, Injecte, Preservative Fre 03/21/2024   PFIZER Comirnaty(Gray Top)Covid-19 Tri-Sucrose Vaccine 01/04/2021   PFIZER(Purple Top)SARS-COV-2 Vaccination 04/07/2020, 08/12/2020, 09/07/2020   Pfizer Covid-19 Vaccine Bivalent Booster 78yrs & up 06/03/2021   Pfizer(Comirnaty)Fall Seasonal Vaccine 12 years and older 03/31/2023   Tdap 06/17/2015   Zoster Recombinant(Shingrix) 03/21/2024   Past Medical History:  Diagnosis Date  Fibroids    With myomectomy   Heartburn    Hyperlipidemia    diet contolled - no meds   Hypothyroidism    Lumbar herniated disc 02/08/2011   PCOS (polycystic ovarian syndrome)    Seasonal allergies    Tachycardia    Allergies  Allergen Reactions   Shellfish Allergy Nausea Only   Latex Itching   Past Surgical History:  Procedure Laterality Date   DILATION AND EVACUATION N/A 10/08/2014   Procedure: DILATATION AND EVACUATION;  Surgeon: Duwaine Blumenthal, DO;  Location: [facility];  Service: Gynecology;  Laterality: N/A;   MYOMECTOMY  2011   abd   Family History  Problem Relation Age of Onset    Hypertension Mother    Uterine cancer Mother    Early death Mother    Miscarriages / Stillbirths Mother    Diabetes Father    Hypertension Father    Heart disease Father    Prostate cancer Father    Aneurysm Father    Breast cancer Maternal Aunt 6   Arthritis Maternal Grandmother    Diabetes Maternal Grandmother    Hypertension Maternal Grandmother    Diabetes Paternal Grandmother    Alzheimer's disease Paternal Grandmother    Asthma Son    Social History   Social History Narrative   Marital status/children/pets: Married   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:Yes     - wears seatbelt: Yes     - Feels safe in their relationships: Yes    Allergies as of 03/21/2024       Reactions   Shellfish Allergy Nausea Only   Latex Itching        Medication List        Accurate as of March 21, 2024 11:00 AM. If you have any questions, ask your nurse or doctor.          STOP taking these medications    augmented betamethasone dipropionate 0.05 % ointment Commonly known as: DIPROLENE-AF Stopped by: Massimo Hartland   multivitamin tablet Stopped by: Charlies Bellini   naltrexone  50 MG tablet Commonly known as: DEPADE Stopped by: Charlies Bellini       TAKE these medications    cyanocobalamin  1000 MCG tablet Commonly known as: VITAMIN B12 Take 1,000 mcg by mouth daily.   levothyroxine  112 MCG tablet Commonly known as: Synthroid  Take 1 tablet (112 mcg total) by mouth daily before breakfast.   Vitamin D  (Ergocalciferol ) 1.25 MG (50000 UNIT) Caps capsule Commonly known as: DRISDOL  Take 1 capsule (50,000 Units total) by mouth every 7 (seven) days.        All past medical history, surgical history, allergies, family history, immunizations andmedications were updated in the EMR today and reviewed under the history and medication portions of their EMR.     No results found for this or any  previous visit (from the past 2160 hours).  ECHOCARDIOGRAM COMPLETE Result Date: 02/16/2021 IMPRESSIONS   1. Left ventricular ejection fraction, by estimation, is 60 to 65%. The left ventricle has normal function. The left ventricle has no regional wall motion abnormalities. Left ventricular diastolic parameters were normal.   2. Right ventricular systolic function is normal. The right ventricular size is normal.   3. The mitral valve is normal in structure. Mild mitral valve regurgitation. No evidence of mitral stenosis.   4. The aortic valve is normal in structure. Aortic valve regurgitation is not  visualized. No aortic stenosis is present.   5. The inferior vena cava is normal in size with greater than 50% respiratory variability, suggesting right atrial pressure of 3 mmHg.   Review of Systems  All other systems reviewed and are negative.  14 pt review of systems performed and negative (unless mentioned in an HPI)  Objective: BP 122/80   Pulse 77   Temp 98.2 F (36.8 C)   Ht 5' 3 (1.6 m)   Wt 244 lb (110.7 kg)   SpO2 98%   BMI 43.22 kg/m  Physical Exam Vitals and nursing note reviewed.  Constitutional:      General: She is not in acute distress.    Appearance: Normal appearance. She is obese. She is not ill-appearing, toxic-appearing or diaphoretic.  HENT:     Head: Normocephalic and atraumatic.     Right Ear: Tympanic membrane, ear canal and external ear normal. There is no impacted cerumen.     Left Ear: Tympanic membrane, ear canal and external ear normal. There is no impacted cerumen.     Nose: No congestion or rhinorrhea.     Mouth/Throat:     Mouth: Mucous membranes are moist.     Pharynx: Oropharynx is clear. No oropharyngeal exudate or posterior oropharyngeal erythema.  Eyes:     General: No scleral icterus.       Right eye: No discharge.        Left eye: No discharge.     Extraocular Movements: Extraocular movements intact.     Conjunctiva/sclera: Conjunctivae  normal.     Pupils: Pupils are equal, round, and reactive to light.  Cardiovascular:     Rate and Rhythm: Normal rate and regular rhythm.     Pulses: Normal pulses.     Heart sounds: Normal heart sounds. No murmur heard.    No friction rub. No gallop.  Pulmonary:     Effort: Pulmonary effort is normal. No respiratory distress.     Breath sounds: Normal breath sounds. No stridor. No wheezing, rhonchi or rales.  Chest:     Chest wall: No tenderness.  Abdominal:     General: Abdomen is flat. Bowel sounds are normal. There is no distension.     Palpations: Abdomen is soft. There is no mass.     Tenderness: There is no abdominal tenderness. There is no right CVA tenderness, left CVA tenderness, guarding or rebound.     Hernia: No hernia is present.  Musculoskeletal:        General: No swelling, tenderness or deformity. Normal range of motion.     Cervical back: Normal range of motion and neck supple. No rigidity or tenderness.     Right lower leg: No edema.     Left lower leg: No edema.  Lymphadenopathy:     Cervical: No cervical adenopathy.  Skin:    General: Skin is warm and dry.     Coloration: Skin is not jaundiced or pale.     Findings: No bruising, erythema, lesion or rash.  Neurological:     General: No focal deficit present.     Mental Status: She is alert and oriented to person, place, and time. Mental status is at baseline.     Cranial Nerves: No cranial nerve deficit.     Sensory: No sensory deficit.     Motor: No weakness.     Coordination: Coordination normal.     Gait: Gait normal.     Deep Tendon Reflexes: Reflexes normal.  Psychiatric:        Mood and Affect: Mood normal.        Behavior: Behavior normal.        Thought Content: Thought content normal.        Judgment: Judgment normal.      No results found.  Assessment/plan: Suzanne Beasley is a 53 y.o. female present for  CPE and chronic condition management  Hypothyroidism due to Hashimoto's  thyroiditis Continue levo 112  mcg for now. refills will be provided in appropriate dose based on lab result today TSH collected today  Vitamin D  deficiency: Supplementing OTC now, unknown dose. She would like to stay on the once weekly if possible. Vitamin D  levels collected Mixed hyperlipidemia/Obesity (BMI 30-39.9)/Family history of heart disease Diet and exercise modifications recommended  Colon cancer screening - cologuard ordered Influenza vaccine needed - Flu vaccine trivalent PF, 6mos and older(Flulaval,Afluria,Fluarix,Fluzone) Need for vaccination for pneumococcus declined Diabetes mellitus screening - Hemoglobin A1c Breast cancer screening by mammogram - MM 3D SCREENING MAMMOGRAM BILATERAL BREAST; Future Need for shingles vaccine - Varicella-zoster vaccine IM B12 deficiency - B12 - supplementing subL 1000 mcg  Routine general medical examination at a health care facility (Primary) Patient was encouraged to exercise greater than 150 minutes a week. Patient was encouraged to choose a diet filled with fresh fruits and vegetables, and lean meats. AVS provided to patient today for education/recommendation on gender specific health and safety maintenance. Colonoscopy: Cologuard negative 11/2020- rpt 3 yrs.>ordered Mammogram: completed: 05/17/2023- [facility].  Ordered for her today. Cervical cancer screening: last pap: 2023- gyn Immunizations: tdap UTD 2017, Influenza - given(encouraged yearly),Shingrix no.1 given, Prevnar 20-declined Infectious disease screening: HIV and Hep C completed DEXA:routine screen to start at 60  Return in about 1 year (around 03/22/2025) for cpe (20 min), Routine chronic condition follow-up. And nurse visit 3-4 months for shingrix No.2  Orders Placed This Encounter  Procedures   MM 3D SCREENING MAMMOGRAM BILATERAL BREAST   Varicella-zoster vaccine IM   Flu vaccine trivalent PF, 6mos and older(Flulaval,Afluria,Fluarix,Fluzone)   Cologuard   CBC    Comprehensive metabolic panel with GFR   Hemoglobin A1c   Lipid panel   TSH   Vitamin D  (25 hydroxy)   B12   Meds ordered this encounter  Medications   Vitamin D , Ergocalciferol , (DRISDOL ) 1.25 MG (50000 UNIT) CAPS capsule    Sig: Take 1 capsule (50,000 Units total) by mouth every 7 (seven) days.    Dispense:  12 capsule    Refill:  3   Referral Orders  No referral(s) requested today      Electronically signed by: Charlies Bellini, DO [facility] [place]

## 2024-03-24 ENCOUNTER — Ambulatory Visit: Payer: Self-pay | Admitting: Family Medicine

## 2024-03-24 DIAGNOSIS — E039 Hypothyroidism, unspecified: Secondary | ICD-10-CM

## 2024-03-24 MED ORDER — LEVOTHYROXINE SODIUM 112 MCG PO TABS: 112.0000 ug | ORAL_TABLET | Freq: Every day | ORAL | 3 refills | Status: AC

## 2024-04-16 LAB — COLOGUARD: COLOGUARD: NEGATIVE

## 2024-05-20 ENCOUNTER — Ambulatory Visit
Admission: RE | Admit: 2024-05-20 | Discharge: 2024-05-20 | Disposition: A | Source: Ambulatory Visit | Attending: Family Medicine | Admitting: Family Medicine

## 2024-05-20 DIAGNOSIS — Z1231 Encounter for screening mammogram for malignant neoplasm of breast: Secondary | ICD-10-CM

## 2024-06-23 ENCOUNTER — Ambulatory Visit (INDEPENDENT_AMBULATORY_CARE_PROVIDER_SITE_OTHER)

## 2024-06-23 DIAGNOSIS — Z23 Encounter for immunization: Secondary | ICD-10-CM

## 2024-06-23 NOTE — Progress Notes (Signed)
 Pt here for Shingrix  # 2.   Pt tolerated injection well.

## 2025-03-23 ENCOUNTER — Encounter: Admitting: Family Medicine
